# Patient Record
Sex: Male | Born: 1975
Health system: Southern US, Community
[De-identification: ages and names within clinical notes are randomized; demographics above are authoritative.]

## PROBLEM LIST (undated history)

## (undated) DIAGNOSIS — F17210 Nicotine dependence, cigarettes, uncomplicated: Secondary | ICD-10-CM

## (undated) DIAGNOSIS — E559 Vitamin D deficiency, unspecified: Secondary | ICD-10-CM

## (undated) DIAGNOSIS — E119 Type 2 diabetes mellitus without complications: Secondary | ICD-10-CM

## (undated) DIAGNOSIS — G4730 Sleep apnea, unspecified: Secondary | ICD-10-CM

## (undated) HISTORY — DX: Nicotine dependence, cigarettes, uncomplicated: F17.210

## (undated) HISTORY — DX: Vitamin D deficiency, unspecified: E55.9

## (undated) HISTORY — DX: Type 2 diabetes mellitus without complications: E11.9

## (undated) HISTORY — DX: Morbid (severe) obesity due to excess calories: E66.01

## (undated) HISTORY — PX: KNEE SURGERY: SHX244

## (undated) HISTORY — DX: Sleep apnea, unspecified: G47.30

---

## 1998-12-19 ENCOUNTER — Emergency Department (HOSPITAL_COMMUNITY): Admission: EM | Admit: 1998-12-19 | Discharge: 1998-12-19 | Payer: Self-pay | Admitting: Emergency Medicine

## 1998-12-19 ENCOUNTER — Encounter: Payer: Self-pay | Admitting: Emergency Medicine

## 2003-07-28 ENCOUNTER — Emergency Department (HOSPITAL_COMMUNITY): Admission: EM | Admit: 2003-07-28 | Discharge: 2003-07-28 | Payer: Self-pay | Admitting: Emergency Medicine

## 2013-10-02 ENCOUNTER — Ambulatory Visit: Payer: Self-pay | Admitting: Family Medicine

## 2013-10-02 VITALS — BP 128/70 | HR 97 | Temp 98.7°F | Resp 18 | Ht 73.0 in | Wt 346.0 lb

## 2013-10-02 DIAGNOSIS — G4733 Obstructive sleep apnea (adult) (pediatric): Secondary | ICD-10-CM

## 2013-10-02 DIAGNOSIS — Z0289 Encounter for other administrative examinations: Secondary | ICD-10-CM

## 2013-10-02 NOTE — Patient Instructions (Addendum)
Work hard on weight loss.  Advise stopping smoking  Return in one year or as needed  Advised to sometime come in for a general physical examination, not for the special DOT exams, but to do laboratory work and other baseline checking of him.

## 2013-10-02 NOTE — Progress Notes (Signed)
Examination:  History: 38 year old man who is here for a DOT physical exam. He is doing well with no major health issues except for a history of sleep apnea. He's been using CPAP for 4 years, uses it faithfully. His machine has diagnostic records on it, and he brought the machine in. This shows he is using his CPAP for an average of 7 hours a 5/90 days for a total of 876 hours in the last 90 days stretch. He says he has to use it all the time. Denies drowsiness problems when he is driving. No other major medical complaints  Past medical history: Operations: None Medical illnesses: None except for the sleep apnea  Regular medications: None Allergies: None   Family history: Father has diabetes. Otherwise no major familial diseases  Social history: Patient is divorced, has 3 children. He still has a relationship with his children. He has a significant other he lives with. He is driving takes him from [place] to [place]. He does smoke. He rarely drinks.  Review of systems:  Constitutional: Unremarkable HEENT: Unremarkable except for occasional bright sun-induced headache Respiratory: Unremarkable Heart pressure: Unremarkable Gastrointestinal: Unremarkable Genitourinary: Unremarkable Muscle skeletal: Unremarkable Dermatologic: Unremarkable Neurologic: Unremarkable Psychiatric: Unremarkable Endocrinologic: Unremarkable Hematologic: Unremarkable   Physical examination: Obese Afro-American male in no major acute distress. TMs are normal. Eyes PERRLA. Fundi benign. Throat was clear. Neck supple without nodes or thyromegaly. He has very large neck consistent with having sleep apnea. His chest is clear to auscultation. Heart regular without murmurs. Abdomen soft without mass or tenderness. Normal male external genitalia testes descended. No hernias. Spine normal. Rectal exam not done. Extremities unremarkable. Skin unremarkable. Spine unremarkable  Assessment: Truck drivers physical  examination Sleep apnea syndrome  Plan: Complete DOT card for one year Advised he must continue using the CPAP as he has been doing 2 strongly to lose weight and stop smoking.

## 2014-09-28 ENCOUNTER — Encounter: Payer: Self-pay | Admitting: Physician Assistant

## 2014-09-28 ENCOUNTER — Ambulatory Visit (INDEPENDENT_AMBULATORY_CARE_PROVIDER_SITE_OTHER): Payer: Self-pay | Admitting: Physician Assistant

## 2014-09-28 VITALS — BP 132/84 | HR 98 | Temp 98.5°F | Resp 18 | Ht 72.0 in | Wt 336.8 lb

## 2014-09-28 DIAGNOSIS — Z9989 Dependence on other enabling machines and devices: Secondary | ICD-10-CM

## 2014-09-28 DIAGNOSIS — Z6841 Body Mass Index (BMI) 40.0 and over, adult: Secondary | ICD-10-CM

## 2014-09-28 DIAGNOSIS — R739 Hyperglycemia, unspecified: Secondary | ICD-10-CM

## 2014-09-28 DIAGNOSIS — R81 Glycosuria: Secondary | ICD-10-CM

## 2014-09-28 DIAGNOSIS — G4733 Obstructive sleep apnea (adult) (pediatric): Secondary | ICD-10-CM

## 2014-09-28 DIAGNOSIS — Z024 Encounter for examination for driving license: Secondary | ICD-10-CM

## 2014-09-28 DIAGNOSIS — Z021 Encounter for pre-employment examination: Secondary | ICD-10-CM

## 2014-09-28 HISTORY — DX: Hyperglycemia, unspecified: R73.9

## 2014-09-28 HISTORY — DX: Body Mass Index (BMI) 40.0 and over, adult: Z684

## 2014-09-28 LAB — GLUCOSE, POCT (MANUAL RESULT ENTRY): POC Glucose: 242 mg/dl — AB (ref 70–99)

## 2014-09-28 NOTE — Progress Notes (Signed)
This patient presents for DOT examination for fitness for duty.  Last DOT certification was for 1 year, expiration date 10/03/2014.  Medical History: no  Any illness or injury in the last 5 years? no  Head/Brain Injuries, disorders or illnesses no  Seizures, epilepsy no  Eye disorders or impaired vision (except corrective lenses) no  Ear disorders, loss of hearing or balance no  Heart disease or heart attack; other cardiovascular condition no  Heart surgery (valve replacement/bypass, angioplasty, pacemaker) no  High blood pressure no  Muscular disease no  Shortness of breath no  Lung disease, emphysema, asthma, chronic bronchitis no  Kidney disease, dialysis no  Liver disease no  Digestive problems no  Diabetes or elevated blood sugar no  Nervious or psychiatric disorders, e.g., severe depression no  Loss of, or altered consciousness no  Fainting, dizziness yes  Sleep disorders, pauses in breathing while asleep, daytime sleepiness, loud snoring no  Stroke or paralysis no  Missing or impaired hand, arm, foot, leg, finger, toe no  Spinal injury or disease no  Chronic low back pain no  Regular, frequent alcohol use no  Narcotic or habit forming drug use  Current Medications: Prior to Admission medications   Not on File    Primary Care Provider: No primary care provider on file. Specialists: None  Medical Examiner's Comments on Health History:  OSA on CPAP. Compliance reviewed on the actual machine. In the past 30 days, used >4 hours 29 days and average use 8 hours/day.  TESTING:   Visual Acuity Screening   Right eye Left eye Both eyes  Without correction: 20/15 20/15 20/15   With correction:     Comments: Color: 6/6 HFOV: 85   Hearing Screening Comments: Whisper: 10 ft pass  Monocular Vision: No.  Hearing Aid used for test: No. Hearing Aid required to to meet standard: No.  BP 132/84 mmHg  Pulse 98  Temp(Src) 98.5 F (36.9 C) (Oral)  Resp 18  Ht 6' (1.829 m)   Wt 336 lb 12.8 oz (152.771 kg)  BMI 45.67 kg/m2  SpO2 96% Pulse rate is regular  Urine Specimen: Specific Gravity 1.030, Protein NEG, Blood NEG, Sugar 100  Other Testing: Finger Stick glucose, non-fasting 242 mg/dL  PHYSICAL EXAMINATION:  1. Yes.   General Appearance: Marked overweight, tremor, signs of alcoholism, problem drinking or drug abuse. 2. No. Eyes: pupillary equality, reaction to light, accommodation, ocular motility, ocular muscle imbalance, extra ocular movement, nystagmus, exopthalmos. Ask about retinopathy, cataracts, aphakia, glaucoma, macular degeneration and refer to a specialist if appropriate.  3. No. Ears: Scarring of tympanic membrane, occlusion of external canal, perforated eardrums.     4. No. Mouth and Throat: Irremedial deformities likely to interfere with breathing or swallowing.    5. No. Heart: Murmurs, extra sounds, enlarged heart, pacemaker, implantable defibrillator.     6. No. Lungs and Chest, not including breast examination: Abnormal Chest wall expansion, abnormal respiratory rate, abnormal breath sounds including wheezes or alveolar rates, impaired respiratory function, cyanosis. Abnormal findings on physical exam may require further testing such as pulmonary tests and/or x ray of chest.  7. No. Abdomen and Viscera: Enlarged liver, enlarged spleen, masses, bruits, hernia, significant abdominal wall muscle weakness.  8. No. Vascular System: Abnormal pulse and amplitude, carotid or arterial bruits, varicose veins.    9. No. Genitourinary System: Hernia  10. No. Extremities-Limb impaired: Loss or impairment of leg, foot, toe, arm, hand, finger. Perceptible limp, deformities, atrophy, weakness, paralysis, clubbing, edema, hypotonia. Insufficient grasp  and prehension to maintain steering wheel grip. Insufficient mobility and strength in lower limb to operate pedals properly. 11. No. Spine, other musculoskeletal: Previous surgery, deformities, limitation of  motion, tenderness.  12. No. Neurological: Impaired equilibrium, coordination or speech pattern; paresthesia, asymmetric deep tendon reflexes, sensory or positional abnormalities, abnormal patellar and Babinski's reflexes, ataxia.     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 Needs to establish with PCP for additional evaluation of hyperglycemia (suspect new diagnosis of Diabetes Mellitus type 2).  Meets standards, but periodic monitoring required due to: OSA, hyperglycemia/diabetes  Driver qualified only for: 3 months  Wearing corrective lenses: no Wearing hearing aid: no Accompanied by a N/A waiver/exemption Skill performance Evaluation (SPE) Certificate: no Driving within an exempt intracity zone: no Qualified by operation of 49 CFR 321.22: no  Certification expires 12/28/2014

## 2015-01-18 ENCOUNTER — Ambulatory Visit: Payer: Self-pay | Admitting: Physician Assistant

## 2016-08-27 ENCOUNTER — Telehealth: Payer: Self-pay | Admitting: Internal Medicine

## 2016-08-27 ENCOUNTER — Ambulatory Visit (INDEPENDENT_AMBULATORY_CARE_PROVIDER_SITE_OTHER): Payer: Managed Care, Other (non HMO) | Admitting: Family Medicine

## 2016-08-27 ENCOUNTER — Encounter: Payer: Self-pay | Admitting: Family Medicine

## 2016-08-27 VITALS — BP 140/82 | HR 90 | Ht 72.5 in | Wt 315.0 lb

## 2016-08-27 DIAGNOSIS — F17200 Nicotine dependence, unspecified, uncomplicated: Secondary | ICD-10-CM | POA: Diagnosis not present

## 2016-08-27 DIAGNOSIS — Z0000 Encounter for general adult medical examination without abnormal findings: Secondary | ICD-10-CM

## 2016-08-27 DIAGNOSIS — Z202 Contact with and (suspected) exposure to infections with a predominantly sexual mode of transmission: Secondary | ICD-10-CM

## 2016-08-27 DIAGNOSIS — Z9989 Dependence on other enabling machines and devices: Secondary | ICD-10-CM

## 2016-08-27 DIAGNOSIS — R739 Hyperglycemia, unspecified: Secondary | ICD-10-CM

## 2016-08-27 DIAGNOSIS — E1142 Type 2 diabetes mellitus with diabetic polyneuropathy: Secondary | ICD-10-CM | POA: Insufficient documentation

## 2016-08-27 DIAGNOSIS — G4733 Obstructive sleep apnea (adult) (pediatric): Secondary | ICD-10-CM | POA: Diagnosis not present

## 2016-08-27 DIAGNOSIS — E119 Type 2 diabetes mellitus without complications: Secondary | ICD-10-CM | POA: Diagnosis not present

## 2016-08-27 DIAGNOSIS — E661 Drug-induced obesity: Secondary | ICD-10-CM | POA: Insufficient documentation

## 2016-08-27 DIAGNOSIS — Z23 Encounter for immunization: Secondary | ICD-10-CM

## 2016-08-27 LAB — CBC WITH DIFFERENTIAL/PLATELET
Basophils Absolute: 86 cells/uL (ref 0–200)
Basophils Relative: 1 %
Eosinophils Absolute: 86 cells/uL (ref 15–500)
Eosinophils Relative: 1 %
HCT: 46.8 % (ref 38.5–50.0)
Hemoglobin: 15.6 g/dL (ref 13.2–17.1)
Lymphocytes Relative: 43 %
Lymphs Abs: 3698 cells/uL (ref 850–3900)
MCH: 29.4 pg (ref 27.0–33.0)
MCHC: 33.3 g/dL (ref 32.0–36.0)
MCV: 88.3 fL (ref 80.0–100.0)
MPV: 10 fL (ref 7.5–12.5)
Monocytes Absolute: 688 cells/uL (ref 200–950)
Monocytes Relative: 8 %
Neutro Abs: 4042 cells/uL (ref 1500–7800)
Neutrophils Relative %: 47 %
Platelets: 241 10*3/uL (ref 140–400)
RBC: 5.3 MIL/uL (ref 4.20–5.80)
RDW: 13.8 % (ref 11.0–15.0)
WBC: 8.6 10*3/uL (ref 4.0–10.5)

## 2016-08-27 LAB — POCT URINALYSIS DIPSTICK
Bilirubin, UA: NEGATIVE
Blood, UA: NEGATIVE
Ketones, UA: NEGATIVE
Leukocytes, UA: NEGATIVE
Nitrite, UA: NEGATIVE
Protein, UA: NEGATIVE
Spec Grav, UA: >=1.03
Urobilinogen, UA: NEGATIVE
pH, UA: 6

## 2016-08-27 LAB — POCT CBG (FASTING - GLUCOSE)-MANUAL ENTRY: Glucose Fasting, POC: 206 mg/dL — AB (ref 70–99)

## 2016-08-27 LAB — LIPID PANEL
CHOL/HDL RATIO: 5.7 ratio — AB (ref <5.0)
CHOLESTEROL: 159 mg/dL (ref <200)
HDL: 28 mg/dL — ABNORMAL LOW (ref >40)
LDL CALC: 92 mg/dL (ref <100)
Triglycerides: 194 mg/dL — ABNORMAL HIGH (ref <150)
VLDL: 39 mg/dL — AB (ref <30)

## 2016-08-27 LAB — COMPREHENSIVE METABOLIC PANEL
ALT: 38 U/L (ref 9–46)
AST: 29 U/L (ref 10–40)
Albumin: 4.3 g/dL (ref 3.6–5.1)
Alkaline Phosphatase: 67 U/L (ref 40–115)
BUN: 8 mg/dL (ref 7–25)
CO2: 25 mmol/L (ref 20–31)
Calcium: 9.6 mg/dL (ref 8.6–10.3)
Chloride: 102 mmol/L (ref 98–110)
Creat: 0.78 mg/dL (ref 0.60–1.35)
Glucose, Bld: 257 mg/dL — ABNORMAL HIGH (ref 65–99)
Potassium: 4.4 mmol/L (ref 3.5–5.3)
Sodium: 137 mmol/L (ref 135–146)
Total Bilirubin: 0.4 mg/dL (ref 0.2–1.2)
Total Protein: 7 g/dL (ref 6.1–8.1)

## 2016-08-27 LAB — TSH: TSH: 1.29 mIU/L (ref 0.40–4.50)

## 2016-08-27 LAB — POCT GLYCOSYLATED HEMOGLOBIN (HGB A1C): Hemoglobin A1C: 13.9

## 2016-08-27 MED ORDER — INSULIN GLARGINE 300 UNIT/ML ~~LOC~~ SOPN: 10.0000 [IU] | PEN_INJECTOR | Freq: Every evening | SUBCUTANEOUS | 2 refills | Status: DC

## 2016-08-27 MED ORDER — BLOOD GLUCOSE TEST VI STRP: ORAL_STRIP | 4 refills | Status: DC

## 2016-08-27 MED ORDER — METRONIDAZOLE 500 MG PO TABS: 2000.0000 mg | ORAL_TABLET | Freq: Once | ORAL | 0 refills | Status: AC

## 2016-08-27 MED ORDER — METFORMIN HCL 500 MG PO TABS: 500.0000 mg | ORAL_TABLET | Freq: Two times a day (BID) | ORAL | 1 refills | Status: DC

## 2016-08-27 MED ORDER — ONETOUCH DELICA LANCETS FINE MISC: 5 refills | Status: DC

## 2016-08-27 NOTE — Patient Instructions (Addendum)
Start Metformin twice daily with meals. Start Toujeo 10 units every evening.  Check your blood sugars twice daily. Once fasting in the morning and 2 hours after a meal.  Keep a record and bring this in to your appointment in 2 weeks.  Goal blood sugar range for fasting is 90-130. Goal blood sugar range for 2 hours after a meal is 130-180.   Call and schedule a diabetic eye exam.  The nutritionist will call you to schedule an appointment.   Do not drink alcohol with the antibiotic.   Cut back on smoking.   Preventative Care for Adults, Male       REGULAR HEALTH EXAMS:  A routine yearly physical is a good way to check in with your primary care provider about your health and preventive screening. It is also an opportunity to share updates about your health and any concerns you have, and receive a thorough all-over exam.   Most health insurance companies pay for at least some preventative services.  Check with your health plan for specific coverages.  WHAT PREVENTATIVE SERVICES DO MEN NEED?  Adult men should have their weight and blood pressure checked regularly.   Men age 74 and older should have their cholesterol levels checked regularly.  Beginning at age 54 and continuing to age 70, men should be screened for colorectal cancer.  Certain people should may need continued testing until age 75.  Other cancer screening may include exams for testicular and prostate cancer.  Updating vaccinations is part of preventative care.  Vaccinations help protect against diseases such as the flu.  Lab tests are generally done as part of preventative care to screen for anemia and blood disorders, to screen for problems with the kidneys and liver, to screen for bladder problems, to check blood sugar, and to check your cholesterol level.  Preventative services generally include counseling about diet, exercise, avoiding tobacco, drugs, excessive alcohol consumption, and sexually transmitted infections.     GENERAL RECOMMENDATIONS FOR GOOD HEALTH:  Healthy diet:  Eat a variety of foods, including fruit, vegetables, animal or vegetable protein, such as meat, fish, chicken, and eggs, or beans, lentils, tofu, and grains, such as rice.  Drink plenty of water daily.  Decrease saturated fat in the diet, avoid lots of red meat, processed foods, sweets, fast foods, and fried foods.  Exercise:  Aerobic exercise helps maintain good heart health. At least 30-40 minutes of moderate-intensity exercise is recommended. For example, a brisk walk that increases your heart rate and breathing. This should be done on most days of the week.   Find a type of exercise or a variety of exercises that you enjoy so that it becomes a part of your daily life.  Examples are running, walking, swimming, water aerobics, and biking.  For motivation and support, explore group exercise such as aerobic class, spin class, Zumba, Yoga,or  martial arts, etc.    Set exercise goals for yourself, such as a certain weight goal, walk or run in a race such as a 5k walk/run.  Speak to your primary care provider about exercise goals.  Disease prevention:  If you smoke or chew tobacco, find out from your caregiver how to quit. It can literally save your life, no matter how long you have been a tobacco user. If you do not use tobacco, never begin.   Maintain a healthy diet and normal weight. Increased weight leads to problems with blood pressure and diabetes.   The Body Mass Index or  BMI is a way of measuring how much of your body is fat. Having a BMI above 27 increases the risk of heart disease, diabetes, hypertension, stroke and other problems related to obesity. Your caregiver can help determine your BMI and based on it develop an exercise and dietary program to help you achieve or maintain this important measurement at a healthful level.  High blood pressure causes heart and blood vessel problems.  Persistent high blood pressure  should be treated with medicine if weight loss and exercise do not work.   Fat and cholesterol leaves deposits in your arteries that can block them. This causes heart disease and vessel disease elsewhere in your body.  If your cholesterol is found to be high, or if you have heart disease or certain other medical conditions, then you may need to have your cholesterol monitored frequently and be treated with medication.   Ask if you should have a stress test if your history suggests this. A stress test is a test done on a treadmill that looks for heart disease. This test can find disease prior to there being a problem.  Avoid drinking alcohol in excess (more than two drinks per day).  Avoid use of street drugs. Do not share needles with anyone. Ask for professional help if you need assistance or instructions on stopping the use of alcohol, cigarettes, and/or drugs.  Brush your teeth twice a day with fluoride toothpaste, and floss once a day. Good oral hygiene prevents tooth decay and gum disease. The problems can be painful, unattractive, and can cause other health problems. Visit your dentist for a routine oral and dental check up and preventive care every 6-12 months.   Look at your skin regularly.  Use a mirror to look at your back. Notify your caregivers of changes in moles, especially if there are changes in shapes, colors, a size larger than a pencil eraser, an irregular border, or development of new moles.  Safety:  Use seatbelts 100% of the time, whether driving or as a passenger.  Use safety devices such as hearing protection if you work in environments with loud noise or significant background noise.  Use safety glasses when doing any work that could send debris in to the eyes.  Use a helmet if you ride a bike or motorcycle.  Use appropriate safety gear for contact sports.  Talk to your caregiver about gun safety.  Use sunscreen with a SPF (or skin protection factor) of 15 or greater.   Lighter skinned people are at a greater risk of skin cancer. Don't forget to also wear sunglasses in order to protect your eyes from too much damaging sunlight. Damaging sunlight can accelerate cataract formation.   Practice safe sex. Use condoms. Condoms are used for birth control and to help reduce the spread of sexually transmitted infections (or STIs).  Some of the STIs are gonorrhea (the clap), chlamydia, syphilis, trichomonas, herpes, HPV (human papilloma virus) and HIV (human immunodeficiency virus) which causes AIDS. The herpes, HIV and HPV are viral illnesses that have no cure. These can result in disability, cancer and death.   Keep carbon monoxide and smoke detectors in your home functioning at all times. Change the batteries every 6 months or use a model that plugs into the wall.   Vaccinations:  Stay up to date with your tetanus shots and other required immunizations. You should have a booster for tetanus every 10 years. Be sure to get your flu shot every year, since 5%-20%  of the [place] population comes down with the flu. The flu vaccine changes each year, so being vaccinated once is not enough. Get your shot in the fall, before the flu season peaks.   Other vaccines to consider:  Pneumococcal vaccine to protect against certain types of pneumonia.  This is normally recommended for adults age 17 or older.  However, adults younger than 41 years old with certain underlying conditions such as diabetes, heart or lung disease should also receive the vaccine.  Shingles vaccine to protect against Varicella Zoster if you are older than age 49, or younger than 41 years old with certain underlying illness.  Hepatitis A vaccine to protect against a form of infection of the liver by a virus acquired from food.  Hepatitis B vaccine to protect against a form of infection of the liver by a virus acquired from blood or body fluids, particularly if you work in health care.  If you plan to travel  internationally, check with your local health department for specific vaccination recommendations.  Cancer Screening:  Most routine colon cancer screening begins at the age of 71. On a yearly basis, doctors may provide special easy to use take-home tests to check for hidden blood in the stool. Sigmoidoscopy or colonoscopy can detect the earliest forms of colon cancer and is life saving. These tests use a Reuter camera at the end of a tube to directly examine the colon. Speak to your caregiver about this at age 69, when routine screening begins (and is repeated every 5 years unless early forms of pre-cancerous polyps or Kostka growths are found).   At the age of 80 men usually start screening for prostate cancer every year. Screening may begin at a younger age for those with higher risk. Those at higher risk include African-Americans or having a family history of prostate cancer. There are two types of tests for prostate cancer:   Prostate-specific antigen (PSA) testing. Recent studies raise questions about prostate cancer using PSA and you should discuss this with your caregiver.   Digital rectal exam (in which your doctor's lubricated and gloved finger feels for enlargement of the prostate through the anus).   Screening for testicular cancer.  Do a monthly exam of your testicles. Gently roll each testicle between your thumb and fingers, feeling for any abnormal lumps. The best time to do this is after a hot shower or bath when the tissues are looser. Notify your caregivers of any lumps, tenderness or changes in size or shape immediately.

## 2016-08-27 NOTE — Telephone Encounter (Signed)
Spoke to patient about his cpap. He has been getting his equipment from [employer] out of [place]. He has not had a new machine or sleep study since 2010. [facility] does not have sleep study on file. Pt will try to get sleep study for me and if not we will have to send for new sleep study. Pt would like to go someplace in [place] to get supplies.

## 2016-08-27 NOTE — Progress Notes (Signed)
Subjective:    Patient ID: Tim Allen, male    DOB: 1976-03-13, 41 y.o.   MRN: [id number]  HPI Chief Complaint  Patient presents with  . new pt    new pt cpe, wanting to quit smoking   He is new to the practice and here for a complete physical exam. States he was diagnosed with sleep apnea at [facility] and is using a CPAP. Has been using it nightly for 5 years. States he cannot sleep without it.  States it is time for new equipment. Would like order for this.   Dr. Benjamine Mola was his last PCP at [facility].   States he is smoking 2 ppd for the past 10 years and smoking total for 20 years. Has tried nicotine patches.  Wife positive for trich. Denies having penile discharge.   Previous medical care: nothing in years.  Last CPE: years   Other providers: none  States he has had elevated blood sugars in the past. States he has been borderline diabetic. Does not eat healthy or exercise. 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  He is symptomatic with urinary frequency, increased thirst and hunger. Denies blurred or double vision.   Social history: Lives with spouse , works as Administrator  Denies alcohol or drug use.   Immunizations: Tdap unknown.   Health maintenance:  Colonoscopy: N/A Last PSA: N/A Last Dental Exam:  Once annually  Last Eye Exam: September 2017, this was not a dilated exam.   Wears seatbelt always, smoke detectors in home and functioning, does not text while driving, feels safe in home environment. Guns in home and locked up.   Reviewed allergies, medications, past medical, surgical, family, and social history.   Review of Systems ROS  Review of Systems Constitutional: -fever, -chills, -sweats, -unexpected weight change,-fatigue ENT: -runny nose, -ear pain, -sore throat Cardiology:  -chest pain, -palpitations, -edema Respiratory: -cough, -shortness of breath, -wheezing Gastroenterology: -abdominal pain, -nausea, -vomiting, -diarrhea, -constipation  Hematology:  -bleeding or bruising problems Musculoskeletal: -arthralgias, -myalgias, -joint swelling, -back pain Ophthalmology: -vision changes Urology: -dysuria, -difficulty urinating, -hematuria, +urinary frequency, -urgency Neurology: -headache, -weakness, -tingling, -numbness       Objective:   Physical Exam BP 140/82   Pulse 90   Ht 6' 0.5" (1.842 m)   Wt (!) 315 lb (142.9 kg)   BMI 42.13 kg/m   General Appearance:    Alert, cooperative, no distress, appears stated age  Head:    Normocephalic, without obvious abnormality, atraumatic  Eyes:    PERRL, conjunctiva/corneas clear, EOM's intact, fundi    benign  Ears:    Normal TM's and external ear canals  Nose:   Nares normal, mucosa normal, no drainage or sinus   tenderness  Throat:   Lips, mucosa, and tongue normal; teeth and gums normal  Neck:   Supple, no lymphadenopathy;  thyroid:  no   enlargement/tenderness/nodules; no carotid   bruit or JVD  Back:    Spine nontender, no curvature, ROM normal, no CVA     tenderness  Lungs:     Clear to auscultation bilaterally without wheezes, rales or     ronchi; respirations unlabored  Chest Wall:    No tenderness or deformity   Heart:    Regular rate and rhythm, S1 and S2 normal, no murmur, rub   or gallop  Breast Exam:    No chest wall tenderness, masses or gynecomastia  Abdomen:     Soft, non-tender, nondistended, normoactive bowel sounds,  no masses, no hepatosplenomegaly  Genitalia:    Refused.   Rectal:    Refused. .  Extremities:   No clubbing, cyanosis or edema  Pulses:   2+ and symmetric all extremities  Skin:   Skin color, texture, turgor normal, no rashes or lesions. Multiple tattoos.   Lymph nodes:   Cervical, supraclavicular, and axillary nodes normal  Neurologic:   CNII-XII intact, normal strength, sensation and gait; reflexes 2+ and symmetric throughout          Psych:   Normal mood, affect, hygiene and grooming.    Urinalysis dipstick: 2+glucose, no ketones.        Assessment & Plan:  Routine general medical examination at a health care facility - Plan: Urinalysis Dipstick, CBC with Differential/Platelet, Comprehensive metabolic panel, TSH, Lipid panel  Elevated blood sugar - Plan: HgB A1c, Glucose (CBG), Fasting, Microalbumin / creatinine urine ratio  New onset type 2 diabetes mellitus ([place]) - Plan: TSH, Amb ref to Medical Nutrition Therapy-MNT  Morbid obesity ([place]) - Plan: TSH, Lipid panel  OSA on CPAP  STD exposure - Plan: RPR, GC/Chlamydia Probe Amp, HIV antibody  Smoker  Need for Tdap vaccination - Plan: Tdap vaccine greater than or equal to 7yo IM  Hemoglobin A1c 13.9%.  POCT fasting glucose 206.  Start metformin twice daily and Toujeo 10 units every evening.  1 sample Toujeo pen given to patient.  He was educated on checking blood sugars and will check BS twice daily, fasting and 2 hours after a meal. He was advised to bring in readings in 2 weeks.  Nutrition referral made.  Needs to schedule diabetic eye exam. He is smoking 2 ppd and advised him to cut back. He is not ready to quit.  Counseled him on risk factors for heart disease including obesity, diabetes, smoking.  Will treat him for Trich since his wife tested positive. He has multiple sexual partners and advised him to have all partners treated. Advised him to avoid alcohol with antibiotic.  Tdap given.  Would like to have CPAP checked and updated.   Follow up 2 weeks and need to repeat urinalysis. He will need an ace inhibitor for renal protection and statin.  Did not want to overwhelm him today, he is having to take his father off of life support later today.

## 2016-08-28 ENCOUNTER — Other Ambulatory Visit: Payer: Self-pay | Admitting: Family Medicine

## 2016-08-28 DIAGNOSIS — I2510 Atherosclerotic heart disease of native coronary artery without angina pectoris: Secondary | ICD-10-CM

## 2016-08-28 LAB — RPR

## 2016-08-28 LAB — MICROALBUMIN / CREATININE URINE RATIO
Creatinine, Urine: 182 mg/dL (ref 20–370)
Microalb Creat Ratio: 3 mcg/mg creat (ref <30)
Microalb, Ur: 0.6 mg/dL

## 2016-08-28 LAB — HIV ANTIBODY (ROUTINE TESTING W REFLEX): HIV 1&2 Ab, 4th Generation: NONREACTIVE

## 2016-08-28 LAB — GC/CHLAMYDIA PROBE AMP
CT Probe RNA: NOT DETECTED
GC PROBE AMP APTIMA: NOT DETECTED

## 2016-08-28 MED ORDER — ATORVASTATIN CALCIUM 20 MG PO TABS: 20.0000 mg | ORAL_TABLET | Freq: Every day | ORAL | 1 refills | Status: DC

## 2016-08-29 ENCOUNTER — Other Ambulatory Visit: Payer: Self-pay | Admitting: Family Medicine

## 2016-08-29 DIAGNOSIS — E7889 Other lipoprotein metabolism disorders: Secondary | ICD-10-CM

## 2016-08-30 ENCOUNTER — Telehealth: Payer: Self-pay | Admitting: Family Medicine

## 2016-08-30 NOTE — Telephone Encounter (Signed)
Rcvd office notes from Dr Morene Crocker at [facility]

## 2016-09-10 ENCOUNTER — Encounter: Payer: Self-pay | Admitting: Family Medicine

## 2016-09-10 ENCOUNTER — Ambulatory Visit (INDEPENDENT_AMBULATORY_CARE_PROVIDER_SITE_OTHER): Payer: Managed Care, Other (non HMO) | Admitting: Family Medicine

## 2016-09-10 VITALS — BP 130/80 | HR 86 | Wt 318.4 lb

## 2016-09-10 DIAGNOSIS — IMO0001 Reserved for inherently not codable concepts without codable children: Secondary | ICD-10-CM

## 2016-09-10 DIAGNOSIS — E1165 Type 2 diabetes mellitus with hyperglycemia: Secondary | ICD-10-CM

## 2016-09-10 MED ORDER — DULAGLUTIDE 0.75 MG/0.5ML ~~LOC~~ SOAJ: 1.0000 "application " | SUBCUTANEOUS | 1 refills | Status: DC

## 2016-09-10 MED ORDER — DAPAGLIFLOZIN PROPANEDIOL 5 MG PO TABS: 5.0000 mg | ORAL_TABLET | Freq: Every day | ORAL | 1 refills | Status: DC

## 2016-09-10 MED ORDER — METFORMIN HCL 1000 MG PO TABS: 1000.0000 mg | ORAL_TABLET | Freq: Two times a day (BID) | ORAL | 1 refills | Status: DC

## 2016-09-10 NOTE — Progress Notes (Signed)
   Subjective:    Patient ID: Tim Allen, male    DOB: 1975-12-14, 41 y.o.   MRN: [id number]  HPI Chief Complaint  Patient presents with  . 2 week follow-up    2 week follow-up. today was fasting at 197. staying in the 200's    He is here to follow up on diabetes. A1c 13.9% 08/27/2016.  He arrived early for his appointment and verbalized that he was upset about waiting to be seen. States he "is trying to be patient but has things to do".   States his fasting BS range has been 200 - 250.  He has been taking Toujeo and metformin without difficulty.  States he is no longer having urinary frequency but he continues to have increased thirst.   States he has a nutritionist appointment April 9th, he and his wife both.   Denies any new complaints or concerns today.   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   Reviewed allergies, medications, past medical,  and social history.   Review of Systems Pertinent positives and negatives in the history of present illness.     Objective:   Physical Exam BP 130/80   Pulse 86   Wt (!) 318 lb 6.4 oz (144.4 kg)   BMI 42.59 kg/m   Alert and oriented and in no acute distress. Not otherwise examined.       Assessment & Plan:  Uncontrolled type 2 diabetes mellitus without complication, without long-term current use of insulin ([place]) - Plan: metFORMIN (GLUCOPHAGE) 1000 MG tablet, dapagliflozin propanediol (FARXIGA) 5 MG TABS tablet, Dulaglutide (TRULICITY) 9.79 YI/0.1KP SOPN  Patient verbalized that he did not have time to spend talking about his diabetes today. He was visibly and verbally upset about having to wait to be seen.  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 Discussed multiple options for getting diabetes under better control including taking  the next 1-2 months off of work to take insulin or STOP insulin and try other medications. He states he cannot take time off from work. We will stop Toujeo and increase his Metformin and add a SGLT2 and GLP1.  Stressed the importance of checking blood sugars twice daily and being aware of any low blood sugars.  Will have him see the MNT as scheduled in April. He states he does not have time to exercise. Advised him that medication can only do so much and that healthy diet and being physically active is the foundation for controlling blood sugars and his illness.  Requested that he call me in 2 weeks with BS readings and return in 1 month. He started taking a statin 2 weeks ago and will need to be put on an ace inhibitor for renal protection at his next visit.  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

## 2016-09-10 NOTE — Patient Instructions (Addendum)
STOP taking insulin TOUJEO.   Check with your pharmacy later and I will send in new prescriptions.  Call me in 2 weeks with your blood sugar readings.  Follow up in 1 month.

## 2016-09-11 ENCOUNTER — Encounter: Payer: Self-pay | Admitting: Family Medicine

## 2016-09-11 ENCOUNTER — Telehealth: Payer: Self-pay | Admitting: Family Medicine

## 2016-09-11 NOTE — Telephone Encounter (Signed)
Wife states Trulicity is not covered at all & cost is $700 a month, Bydureon will be $300 a month with discount card, asked that you please switch him

## 2016-09-11 NOTE — Telephone Encounter (Signed)
Please take care of this, switching him to Bydureon instead of Trulicity. I messaged his wife and told her to check and see if we have a sample of bydureon and to come by for a coupon for [place] if she does not have one.

## 2016-09-12 ENCOUNTER — Encounter: Payer: Self-pay | Admitting: Internal Medicine

## 2016-09-12 ENCOUNTER — Other Ambulatory Visit: Payer: Self-pay | Admitting: Family Medicine

## 2016-09-12 MED ORDER — EXENATIDE ER 2 MG/0.85ML ~~LOC~~ AUIJ: 1.0000 "pen " | AUTO-INJECTOR | SUBCUTANEOUS | 0 refills | Status: DC

## 2016-09-12 MED ORDER — EXENATIDE ER 2 MG/0.85ML ~~LOC~~ AUIJ: 1.0000 "pen " | AUTO-INJECTOR | SUBCUTANEOUS | 2 refills | Status: DC

## 2016-09-12 NOTE — Telephone Encounter (Signed)
Sent to pharmacy 

## 2016-10-01 ENCOUNTER — Encounter: Payer: Managed Care, Other (non HMO) | Attending: Family Medicine | Admitting: Dietician

## 2016-10-01 ENCOUNTER — Encounter: Payer: Self-pay | Admitting: Dietician

## 2016-10-01 DIAGNOSIS — F17200 Nicotine dependence, unspecified, uncomplicated: Secondary | ICD-10-CM | POA: Insufficient documentation

## 2016-10-01 DIAGNOSIS — Z713 Dietary counseling and surveillance: Secondary | ICD-10-CM | POA: Diagnosis present

## 2016-10-01 DIAGNOSIS — E119 Type 2 diabetes mellitus without complications: Secondary | ICD-10-CM | POA: Diagnosis present

## 2016-10-01 DIAGNOSIS — G4733 Obstructive sleep apnea (adult) (pediatric): Secondary | ICD-10-CM | POA: Insufficient documentation

## 2016-10-01 DIAGNOSIS — C50929 Malignant neoplasm of unspecified site of unspecified male breast: Secondary | ICD-10-CM | POA: Insufficient documentation

## 2016-10-01 NOTE — Patient Instructions (Addendum)
Rethink what you drink. Begin looking at your portion sizes.  Consider decreasing them. When eating out, avoid super sizing. When eating carbohydrates, choose a [place] amount of protein. Bake, broil or grill rather than fry.  Aim for 5 Carb Choices per meal (75 grams) +/- 1 either way  Aim for 0-2 Carbs per snack if hungry  Include protein in moderation with your meals and snacks Consider reading food labels for Total Carbohydrate and Fat Grams of foods Consider  increasing your activity level by walking or other for 30 minutes daily as tolerated Consider checking BG at alternate times per day as directed by MD  Consider taking medication as directed by MD

## 2016-10-02 NOTE — Progress Notes (Signed)
Diabetes Self-Management Education  Visit Type: First/Initial  Appt. Start Time: 1410 Appt. End Time: 0938  10/02/2016  Mr. Tim Allen, identified by name and date of birth, is a 41 y.o. male with a diagnosis of Diabetes: Type 2.  This is a new diagnosis.  Other hx includes OSA and uses C-pap.  He smokes.  He reports a fear of needles and it took him a while to begin taking his injectable medication.  Medications include:  Farxiga, Bydureon, Metformin.  He complains of diarrhea with the Metformin.  He has not started the Lipitor yet because he does not want to.  Patient lives with his wife.  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  He does load and unload his truck.  He cooks when he is home and he and his wife share shopping.  They also have 54 and 57 yo children.  His wife was also just diagnosed with type 2 diabetes and breast cancer.  He used to drink a 12 pack of regular soda per day and has decreased this to 2 per day.  Further change is overwhelming him and is resistant to making further changes to his diet or exercise.  His wife is trying to encourage him to make little changes.  He was on his cell phone much of the visit.  ASSESSMENT  Height 6' 1"  (1.854 m), weight (!) 317 lb (143.8 kg). Body mass index is 41.82 kg/m.  He has weighed >300 lbs for 6 years.      Diabetes Self-Management Education - 10/01/16 1428      Visit Information   Visit Type First/Initial     Initial Visit   Diabetes Type Type 2   Are you currently following a meal plan? No   Are you taking your medications as prescribed? Yes   Date Diagnosed 08/2016     Health Coping   How would you rate your overall health? Fair     Psychosocial Assessment   Patient Belief/Attitude about Diabetes Other (comment)  not any of these   Self-care barriers Other (comment)  truck driver ([place])   Self-management support [place] office;Family   Other persons present  Patient;Spouse/SO   Patient Concerns Nutrition/Meal planning;Glycemic Control;Weight Control   Special Needs None   Preferred Learning Style No preference indicated   Learning Readiness Ready   How often do you need to have someone help you when you read instructions, pamphlets, or other written materials from your doctor or pharmacy? 1 - Never   What is the last grade level you completed in school? 12th grade     Pre-Education Assessment   Patient understands the diabetes disease and treatment process. Needs Instruction   Patient understands incorporating nutritional management into lifestyle. Needs Instruction   Patient undertands incorporating physical activity into lifestyle. Needs Instruction   Patient understands using medications safely. Needs Instruction   Patient understands monitoring blood glucose, interpreting and using results Needs Instruction   Patient understands prevention, detection, and treatment of acute complications. Needs Instruction   Patient understands prevention, detection, and treatment of chronic complications. Needs Instruction   Patient understands how to develop strategies to address psychosocial issues. Needs Instruction   Patient understands how to develop strategies to promote health/change behavior. Needs Instruction     Complications   Last HgB A1C per patient/outside source 13.9 %  08/27/2016   How often do you check your blood sugar? 1-2 times/day  Fasting Blood glucose range (mg/dL) 130-179   Number of hypoglycemic episodes per month 0   Number of hyperglycemic episodes per week 7   Have you had a dilated eye exam in the past 12 months? No   Have you had a dental exam in the past 12 months? Yes   Are you checking your feet? Yes   How many days per week are you checking your feet? 7     Dietary Intake   Breakfast 2 Sausage biscuits and whole milk OR cereal and whole milk   Snack (morning) none   Lunch skips   Snack (afternoon) peanuts, raisins,  fruit (quit [employer])   Dinner [employer] or truck stop   Snack (evening) salad, cookies   Beverage(s) regular soda (2 cans per day decreased from 8 cans per day since diagnosis), water, sweet tea, vitamin water     Exercise   Exercise Type Moderate (swimming / aerobic walking)   How many days per week to you exercise? 3   How many minutes per day do you exercise? 60   Total minutes per week of exercise 180     Patient Education   Previous Diabetes Education No   Disease state  Definition of diabetes, type 1 and 2, and the diagnosis of diabetes   Nutrition management  Role of diet in the treatment of diabetes and the relationship between the three main macronutrients and blood glucose level;Food label reading, portion sizes and measuring food.;Meal options for control of blood glucose level and chronic complications.;Information on hints to eating out and maintain blood glucose control.   Physical activity and exercise  Role of exercise on diabetes management, blood pressure control and cardiac health.;Helped patient identify appropriate exercises in relation to his/her diabetes, diabetes complications and other health issue.   Monitoring Purpose and frequency of SMBG.;Identified appropriate SMBG and/or A1C goals.;Daily foot exams;Yearly dilated eye exam   Acute complications Taught treatment of hypoglycemia - the 15 rule.;Discussed and identified patients' treatment of hyperglycemia.   Chronic complications Relationship between chronic complications and blood glucose control;Assessed and discussed foot care and prevention of foot problems;Dental care;Retinopathy and reason for yearly dilated eye exams   Psychosocial adjustment Worked with patient to identify barriers to care and solutions;Role of stress on diabetes;Travel strategies;Identified and addressed patients feelings and concerns about diabetes;Brainstormed with patient on coping mechanisms for social situations, getting support from  significant others, dealing with feelings about diabetes   Personal strategies to promote health Review risk of smoking and offered smoking cessation;Lifestyle issues that need to be addressed for better diabetes care     Individualized Goals (developed by patient)   Nutrition General guidelines for healthy choices and portions discussed   Physical Activity Exercise 3-5 times per week;30 minutes per day   Medications take my medication as prescribed   Monitoring  test my blood glucose as discussed   Problem Solving portion sizes, meal timing   Reducing Risk stop smoking;do foot checks daily;Other (comment)  reduce sugary drinks   Health Coping discuss diabetes with (comment)  MD/RD/wife     Post-Education Assessment   Patient understands the diabetes disease and treatment process. Demonstrates understanding / competency   Patient understands incorporating nutritional management into lifestyle. Demonstrates understanding / competency   Patient undertands incorporating physical activity into lifestyle. Demonstrates understanding / competency   Patient understands using medications safely. Demonstrates understanding / competency   Patient understands monitoring blood glucose, interpreting and using results Demonstrates understanding / competency  Patient understands prevention, detection, and treatment of acute complications. Demonstrates understanding / competency   Patient understands prevention, detection, and treatment of chronic complications. Demonstrates understanding / competency   Patient understands how to develop strategies to address psychosocial issues. Demonstrates understanding / competency   Patient understands how to develop strategies to promote health/change behavior. Demonstrates understanding / competency     Outcomes   Expected Outcomes Demonstrated limited interest in learning.  Expect minimal changes   Future DMSE PRN   Program Status Completed       Individualized Plan for Diabetes Self-Management Training:   Learning Objective:  Patient will have a greater understanding of diabetes self-management. Patient education plan is to attend individual and/or group sessions per assessed needs and concerns.   Plan:   Patient Instructions  Rethink what you drink. Begin looking at your portion sizes.  Consider decreasing them. When eating out, avoid super sizing. When eating carbohydrates, choose a [place] amount of protein. Bake, broil or grill rather than fry.  Aim for 5 Carb Choices per meal (75 grams) +/- 1 either way  Aim for 0-2 Carbs per snack if hungry  Include protein in moderation with your meals and snacks Consider reading food labels for Total Carbohydrate and Fat Grams of foods Consider  increasing your activity level by walking or other for 30 minutes daily as tolerated Consider checking BG at alternate times per day as directed by MD  Consider taking medication as directed by MD        Expected Outcomes:  Demonstrated limited interest in learning.  Expect minimal changes  Education material provided: Living Well with Diabetes, Food label handouts, A1C conversion sheet, My Plate and Snack sheet  If problems or questions, patient to contact team via:  Phone and Email  Future DSME appointment: PRN

## 2016-10-08 ENCOUNTER — Encounter: Payer: Self-pay | Admitting: Family Medicine

## 2016-10-08 ENCOUNTER — Ambulatory Visit (INDEPENDENT_AMBULATORY_CARE_PROVIDER_SITE_OTHER): Payer: Managed Care, Other (non HMO) | Admitting: Family Medicine

## 2016-10-08 VITALS — BP 140/90 | HR 94 | Wt 321.4 lb

## 2016-10-08 DIAGNOSIS — Z9989 Dependence on other enabling machines and devices: Secondary | ICD-10-CM | POA: Diagnosis not present

## 2016-10-08 DIAGNOSIS — F17200 Nicotine dependence, unspecified, uncomplicated: Secondary | ICD-10-CM

## 2016-10-08 DIAGNOSIS — E119 Type 2 diabetes mellitus without complications: Secondary | ICD-10-CM | POA: Diagnosis not present

## 2016-10-08 DIAGNOSIS — G4700 Insomnia, unspecified: Secondary | ICD-10-CM | POA: Diagnosis not present

## 2016-10-08 DIAGNOSIS — G4733 Obstructive sleep apnea (adult) (pediatric): Secondary | ICD-10-CM | POA: Diagnosis not present

## 2016-10-08 MED ORDER — METFORMIN HCL ER 500 MG PO TB24: 1000.0000 mg | ORAL_TABLET | Freq: Every day | ORAL | 1 refills | Status: DC

## 2016-10-08 MED ORDER — LISINOPRIL 5 MG PO TABS: 5.0000 mg | ORAL_TABLET | Freq: Every day | ORAL | 1 refills | Status: DC

## 2016-10-08 NOTE — Patient Instructions (Signed)
Stop the metformin and start the extended release Glucophage 1,000 mg every morning and see how you do with this.   Start back on cholesterol medication, atorvastatin.   Try to be more active especially on your days off.

## 2016-10-08 NOTE — Progress Notes (Signed)
   Subjective:    Patient ID: Tim Allen, male    DOB: 1975/09/29, 41 y.o.   MRN: [id number]  HPI Chief Complaint  Patient presents with  . Diabetes    DM- sugars have been running all over the place, BS between 132-282.    He is here for a follow on diabetes and has other questions. Would like to know which medication is making him have more bowel movements.  His last hemoglobin was 13.9%  On March 5th. States he went to the nutritionist and has made healthy dietary changes but he admits that he often still eats sweets and pizza. Has cut back from 12 pack of soda per day to 2-3 per day.   States he is always tired and is not sleeping well. This is ongoing. 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  He has OSA and states his last sleep study was in [place] approximately 5 years ago. He is using his CPAP but states he would like a new sleep test and states he needs new equipment.   Blood sugars at home Fasting blood sugars range from 132-291  States he has not been taking his cholesterol medication.  He has not cut back on smoking and states he is smoking more.   Denies fever, chills, headache, dizziness, vision changes, polyuria, polydipsia. No chest pain, shortness of breath,  abdominal pain, N/V.   Reviewed allergies, medications, past medical,  and social history.    Review of Systems Pertinent positives and negatives in the history of present illness.     Objective:   Physical Exam BP 140/90   Pulse 94   Wt (!) 321 lb 6.4 oz (145.8 kg)   BMI 42.40 kg/m   Alert and oriented and in no acute distress. Not otherwise examined.       Assessment & Plan:  New onset type 2 diabetes mellitus ([place]) - Plan: metFORMIN (GLUCOPHAGE XR) 500 MG 24 hr tablet, POCT glycosylated hemoglobin (Hb A1C)  Morbid obesity ([facility])  OSA on CPAP - Plan: Home sleep test  Smoker  Insomnia, unspecified type - Plan: Home  sleep test  Discussed that he has made progress with his diet and blood sugars. His hemoglobin A1c is now out of the toxic range and down to 9.7%.  Plan to have him stop Metformin since this is most likely making him have more bowel movements and will switch him to Glucophage XR.  encouraged him to start the cholesterol medication.   Plan to start him on a low dose Ace inhibitor for renal protection.  He is requesting a sleep study. I will order this and he is aware that this may not be covered since his last sleep study was approximately 5 years ago. He may just need to have a visit for new equipment.  He is now smoking even more. Encourage him to start getting exercise and find something healthy to do instead of smoking.  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 He declines for now.  Will follow up in 1-2 months for DM or sooner if needed. He will need fasting lipids if he starts taking statin as recommended. Counseled on uncontrolled diabetes, HTN, hyperlipidemia and smoking and the health risks these present including kidney disease, heart disease, amputations, and even death.

## 2016-10-09 ENCOUNTER — Encounter: Payer: Self-pay | Admitting: Internal Medicine

## 2016-10-09 LAB — POCT GLYCOSYLATED HEMOGLOBIN (HGB A1C): Hemoglobin A1C: 9.7

## 2016-10-09 NOTE — Addendum Note (Signed)
Addended by: Minette Headland A on: 10/09/2016 09:01 AM   Modules accepted: Orders

## 2016-11-11 ENCOUNTER — Emergency Department (HOSPITAL_COMMUNITY): Payer: Managed Care, Other (non HMO)

## 2016-11-11 ENCOUNTER — Emergency Department (HOSPITAL_COMMUNITY)
Admission: EM | Admit: 2016-11-11 | Discharge: 2016-11-11 | Disposition: A | Discharge status: Home / Self Care | Payer: Managed Care, Other (non HMO) | Attending: Emergency Medicine | Admitting: Emergency Medicine

## 2016-11-11 ENCOUNTER — Encounter (HOSPITAL_COMMUNITY): Payer: Self-pay | Admitting: Emergency Medicine

## 2016-11-11 DIAGNOSIS — Z7984 Long term (current) use of oral hypoglycemic drugs: Secondary | ICD-10-CM | POA: Diagnosis not present

## 2016-11-11 DIAGNOSIS — Z79899 Other long term (current) drug therapy: Secondary | ICD-10-CM | POA: Insufficient documentation

## 2016-11-11 DIAGNOSIS — F17200 Nicotine dependence, unspecified, uncomplicated: Secondary | ICD-10-CM | POA: Insufficient documentation

## 2016-11-11 DIAGNOSIS — E119 Type 2 diabetes mellitus without complications: Secondary | ICD-10-CM | POA: Diagnosis not present

## 2016-11-11 DIAGNOSIS — M25552 Pain in left hip: Secondary | ICD-10-CM | POA: Insufficient documentation

## 2016-11-11 LAB — CBC WITH DIFFERENTIAL/PLATELET
Basophils Absolute: 0 10*3/uL (ref 0.0–0.1)
Basophils Relative: 0 %
Eosinophils Absolute: 0.1 10*3/uL (ref 0.0–0.7)
Eosinophils Relative: 1 %
HEMATOCRIT: 42.5 % (ref 39.0–52.0)
HEMOGLOBIN: 14.2 g/dL (ref 13.0–17.0)
LYMPHS ABS: 3.5 10*3/uL (ref 0.7–4.0)
LYMPHS PCT: 31 %
MCH: 29.5 pg (ref 26.0–34.0)
MCHC: 33.4 g/dL (ref 30.0–36.0)
MCV: 88.2 fL (ref 78.0–100.0)
MONOS PCT: 8 %
Monocytes Absolute: 0.9 10*3/uL (ref 0.1–1.0)
NEUTROS ABS: 6.6 10*3/uL (ref 1.7–7.7)
NEUTROS PCT: 60 %
Platelets: 254 10*3/uL (ref 150–400)
RBC: 4.82 MIL/uL (ref 4.22–5.81)
RDW: 13.6 % (ref 11.5–15.5)
WBC: 11.1 10*3/uL — ABNORMAL HIGH (ref 4.0–10.5)

## 2016-11-11 LAB — BASIC METABOLIC PANEL
Anion gap: 6 (ref 5–15)
BUN: 9 mg/dL (ref 6–20)
CHLORIDE: 101 mmol/L (ref 101–111)
CO2: 27 mmol/L (ref 22–32)
Calcium: 8.8 mg/dL — ABNORMAL LOW (ref 8.9–10.3)
Creatinine, Ser: 0.79 mg/dL (ref 0.61–1.24)
GFR calc Af Amer: >60 mL/min (ref >60)
GFR calc non Af Amer: >60 mL/min (ref >60)
GLUCOSE: 175 mg/dL — AB (ref 65–99)
Potassium: 3.6 mmol/L (ref 3.5–5.1)
Sodium: 134 mmol/L — ABNORMAL LOW (ref 135–145)

## 2016-11-11 LAB — C-REACTIVE PROTEIN: CRP: 2 mg/dL — ABNORMAL HIGH (ref <1.0)

## 2016-11-11 LAB — SEDIMENTATION RATE: SED RATE: 37 mm/h — AB (ref 0–16)

## 2016-11-11 MED ORDER — NAPROXEN 500 MG PO TABS: 500.0000 mg | ORAL_TABLET | Freq: Two times a day (BID) | ORAL | 0 refills | Status: DC

## 2016-11-11 MED ORDER — KETOROLAC TROMETHAMINE 30 MG/ML IJ SOLN
30.0000 mg | Freq: Once | INTRAMUSCULAR | Status: AC
  Administered 2016-11-11: 30 mg via INTRAVENOUS
  Filled 2016-11-11: qty 1

## 2016-11-11 NOTE — ED Provider Notes (Signed)
[facility] DEPT Provider Note   CSN: [id number] Arrival date & time: 11/11/16  4742     History   Chief Complaint Chief Complaint  Patient presents with  . Hip Pain    HPI Tim Allen is a 41 y.o. male.  41yo M w/ h/o type 2 diabetes mellitus, OSA, morbid obesity who presents with left hip pain. The patient reports gradually worsening left hip pain that began 2 days ago while driving. He is a Administrator by trade. The pain is present at rest but worse with movement. No radiation to back or down leg. No fevers or recent illness. No trauma or change in physical activity. He has taken 2 percocets over the last 12 hours that helped him sleep but did not help with pain. No leg weakness or numbness.   The history is provided by the patient.  Hip Pain     Past Medical History:  Diagnosis Date  . Heavy cigarette smoker   . Morbid obesity ([place])   . Sleep apnea   . Type 2 diabetes mellitus ([place])    new diagnosis with HgB A1c 13.9% on 08/27/16    Patient Active Problem List   Diagnosis Date Noted  . ASCVD (arteriosclerotic cardiovascular disease) 08/28/2016  . New onset type 2 diabetes mellitus ([place]) 08/27/2016  . Morbid obesity ([place]) 08/27/2016  . Smoker 08/27/2016  . OSA on CPAP 09/28/2014  . BMI 45.0-49.9, adult ([place]) 09/28/2014  . Hyperglycemia 09/28/2014    Past Surgical History:  Procedure Laterality Date  . KNEE SURGERY Right age 67   reconstruction after traumatic injury       Home Medications    Prior to Admission medications   Medication Sig Start Date End Date Taking? Authorizing Provider  atorvastatin (LIPITOR) 20 MG tablet Take 1 tablet (20 mg total) by mouth daily. 08/28/16  Yes Henson, [name] L, NP-C  dapagliflozin propanediol (FARXIGA) 5 MG TABS tablet Take 5 mg by mouth daily. 09/10/16  Yes Henson, [name] L, NP-C  Glucose Blood (BLOOD GLUCOSE TEST STRIPS) STRP Test twice a day. Pt uses one touch verio flex meter 08/27/16  Yes Henson, [name] L,  NP-C  ibuprofen (ADVIL,MOTRIN) 200 MG tablet Take 200 mg by mouth every 6 (six) hours as needed for moderate pain.   Yes [provider]  lisinopril (PRINIVIL,ZESTRIL) 5 MG tablet Take 1 tablet (5 mg total) by mouth daily. 10/08/16  Yes Henson, [name] L, NP-C  metFORMIN (GLUCOPHAGE XR) 500 MG 24 hr tablet Take 2 tablets (1,000 mg total) by mouth daily with breakfast. 10/08/16  Yes Henson, [name] L, NP-C  ONETOUCH DELICA LANCETS FINE MISC Test twice a day 08/27/16  Yes Henson, [name] L, NP-C  TRULICITY 5.95 GL/8.7FI SOPN Inject 0.75 mg as directed once a week. Sunday 10/22/16  Yes [provider]  Exenatide ER (BYDUREON BCISE) 2 MG/0.85ML AUIJ Inject 1 pen into the skin once a week. Patient not taking: Reported on 11/11/2016 09/12/16   Harland Dingwall L, NP-C  Exenatide ER (BYDUREON BCISE) 2 MG/0.85ML AUIJ Inject 1 pen into the skin once a week. Patient not taking: Reported on 11/11/2016 09/12/16   Girtha Rm, NP-C  naproxen (NAPROSYN) 500 MG tablet Take 1 tablet (500 mg total) by mouth 2 (two) times daily with a meal. 11/11/16   Little, Wenda Overland, MD    Family History Family History  Problem Relation Age of Onset  . Diabetes Father     Social History Social History  Substance Use  Topics  . Smoking status: Current Every Day Smoker    Packs/day: 2.00    Years: 10.00  . Smokeless tobacco: Never Used  . Alcohol use No     Allergies   Patient has no known allergies.   Review of Systems Review of Systems All other systems reviewed and are negative except that which was mentioned in HPI  Physical Exam Updated Vital Signs BP 126/87 (BP Location: Right Arm)   Pulse 88   Temp 98.5 F (36.9 C) (Oral)   Resp 18   SpO2 98%   Physical Exam  Constitutional: He is oriented to person, place, and time. He appears well-developed and well-nourished. No distress.  HENT:  Head: Normocephalic and atraumatic.  Moist mucous membranes  Eyes: Conjunctivae are normal. Pupils  are equal, round, and reactive to light.  Neck: Neck supple.  Cardiovascular: Normal rate, regular rhythm, normal heart sounds and intact distal pulses.   No murmur heard. Pulmonary/Chest: Effort normal and breath sounds normal.  Abdominal: Soft. Bowel sounds are normal. He exhibits no distension. There is no tenderness.  Musculoskeletal: He exhibits no edema.  Range of motion of left hip limited due to pain, pain w/ internal and external rotation; no low back tenderness  Neurological: He is alert and oriented to person, place, and time. No sensory deficit.  Fluent speech  Skin: Skin is warm and dry.  Psychiatric: He has a normal mood and affect. Judgment normal.  Nursing note and vitals reviewed.    ED Treatments / Results  Labs (all labs ordered are listed, but only abnormal results are displayed) Labs Reviewed  BASIC METABOLIC PANEL - Abnormal; Notable for the following:       Result Value   Sodium 134 (*)    Glucose, Bld 175 (*)    Calcium 8.8 (*)    All other components within normal limits  CBC WITH DIFFERENTIAL/PLATELET - Abnormal; Notable for the following:    WBC 11.1 (*)    All other components within normal limits  C-REACTIVE PROTEIN - Abnormal; Notable for the following:    CRP 2.0 (*)    All other components within normal limits  SEDIMENTATION RATE - Abnormal; Notable for the following:    Sed Rate 37 (*)    All other components within normal limits    EKG  EKG Interpretation None       Radiology Dg Hip Unilat With Pelvis 2-3 Views Left  Result Date: 11/11/2016 CLINICAL DATA:  Left hip pain.  No known injury. EXAM: DG HIP (WITH OR WITHOUT PELVIS) 2-3V LEFT COMPARISON:  None. FINDINGS: Hip joints and SI joints are symmetric and unremarkable. No acute bony abnormality. Specifically, no fracture, subluxation, or dislocation. Soft tissues are intact. IMPRESSION: No acute bony abnormality. Electronically Signed   By: Rolm Baptise M.D.   On: 11/11/2016 09:21     Procedures Procedures (including critical care time)  Medications Ordered in ED Medications  ketorolac (TORADOL) 30 MG/ML injection 30 mg (30 mg Intravenous Given 11/11/16 1001)     Initial Impression / Assessment and Plan / ED Course  I have reviewed the triage vital signs and the nursing notes.  Pertinent labs & imaging results that were available during my care of the patient were reviewed by me and considered in my medical decision making (see chart for details).     Pt w/ a few days of Atraumatic left hip pain. He was neurovascularly intact distally, afebrile with normal vital signs. No skin changes.  X-ray of hip is unremarkable without effusion, bony changes, or acute trauma. Because his pain has not resulted from increased physical activity or trauma, I did obtain labs to evaluate for signs of infection although septic joint in him would be extremely unlikely. WBC 11.1, CRP 20, ESR 37. His labs are not significantly elevated as one would right with osteomyelitis or septic joint. Patient resting comfortably after receiving Toradol. I provided him with orthopedics follow-up information and extensively reviewed return precautions with him including any signs of infection or neurovascular compromise. He is not appear to have any related back pain. It is possible that his symptoms are related to sitting frequently for his job. I discussed supportive measures and patient discharged in satisfactory condition.  Final Clinical Impressions(s) / ED Diagnoses   Final diagnoses:  Left hip pain    New Prescriptions Discharge Medication List as of 11/11/2016 12:29 PM    START taking these medications   Details  naproxen (NAPROSYN) 500 MG tablet Take 1 tablet (500 mg total) by mouth 2 (two) times daily with a meal., Starting Sun 11/11/2016, Print         Little, Wenda Overland, MD 11/11/16 1743

## 2016-11-11 NOTE — ED Triage Notes (Signed)
Patient in with complaints of left hip pain. Denies trauma. Pain 7/10. Ambulatory.

## 2016-11-11 NOTE — Discharge Instructions (Signed)
RETURN TO ER IF WORSENING PAIN, LEG NUMBNESS/WEAKNESS, OR FEVER. FOLLOW UP WITH ORTHOPEDIC CLINIC.

## 2016-11-20 ENCOUNTER — Other Ambulatory Visit: Payer: Self-pay | Admitting: Family Medicine

## 2016-11-20 DIAGNOSIS — IMO0001 Reserved for inherently not codable concepts without codable children: Secondary | ICD-10-CM

## 2016-11-20 DIAGNOSIS — E1165 Type 2 diabetes mellitus with hyperglycemia: Principal | ICD-10-CM

## 2016-12-09 ENCOUNTER — Other Ambulatory Visit: Payer: Self-pay | Admitting: Family Medicine

## 2016-12-09 DIAGNOSIS — E119 Type 2 diabetes mellitus without complications: Secondary | ICD-10-CM

## 2016-12-10 ENCOUNTER — Encounter: Payer: Self-pay | Admitting: Family Medicine

## 2016-12-10 ENCOUNTER — Other Ambulatory Visit: Payer: Self-pay | Admitting: Family Medicine

## 2016-12-10 DIAGNOSIS — E119 Type 2 diabetes mellitus without complications: Secondary | ICD-10-CM

## 2016-12-24 ENCOUNTER — Encounter (HOSPITAL_BASED_OUTPATIENT_CLINIC_OR_DEPARTMENT_OTHER): Payer: Managed Care, Other (non HMO)

## 2017-01-21 ENCOUNTER — Ambulatory Visit (HOSPITAL_BASED_OUTPATIENT_CLINIC_OR_DEPARTMENT_OTHER): Payer: Managed Care, Other (non HMO) | Attending: Family Medicine | Admitting: Internal Medicine

## 2017-01-21 DIAGNOSIS — G4733 Obstructive sleep apnea (adult) (pediatric): Secondary | ICD-10-CM | POA: Insufficient documentation

## 2017-01-23 ENCOUNTER — Other Ambulatory Visit (HOSPITAL_BASED_OUTPATIENT_CLINIC_OR_DEPARTMENT_OTHER): Payer: Self-pay

## 2017-01-23 DIAGNOSIS — G4700 Insomnia, unspecified: Secondary | ICD-10-CM

## 2017-01-23 DIAGNOSIS — G4733 Obstructive sleep apnea (adult) (pediatric): Secondary | ICD-10-CM

## 2017-01-23 DIAGNOSIS — Z9989 Dependence on other enabling machines and devices: Principal | ICD-10-CM

## 2017-01-26 DIAGNOSIS — G4733 Obstructive sleep apnea (adult) (pediatric): Secondary | ICD-10-CM | POA: Diagnosis not present

## 2017-01-26 NOTE — Procedures (Signed)
   Patient Name: Tim Allen, Tim Allen Date: 01/21/2017 Gender: Male D.O.B: 16-Jul-1975 Age (years): 16 Referring Provider: Girtha Rm NP Height (inches): 75 Interpreting Physician: Baird Lyons MD, ABSM Weight (lbs): 317 RPSGT: Jacolyn Reedy BMI: 42 MRN: [id number] Neck Size: 22.00 CLINICAL INFORMATION Sleep Study Type:unattended  HST  Indication for sleep study: OSA  Epworth Sleepiness Score: 3  SLEEP STUDY TECHNIQUE A multi-channel overnight portable sleep study was performed. The channels recorded were: nasal airflow, thoracic respiratory movement, and oxygen saturation with a pulse oximetry. Snoring was also monitored.  MEDICATIONS Patient self administered medications include: none reported.  SLEEP ARCHITECTURE Patient was studied for 371.5 minutes. The sleep efficiency was 100.0 % and the patient was supine for 0%. The arousal index was 0.0 per hour.  RESPIRATORY PARAMETERS The overall AHI was 27.1 per hour, with a central apnea index of 0.0 per hour.  The oxygen nadir was 86% during sleep.  CARDIAC DATA Mean heart rate during sleep was 97.1 bpm.  IMPRESSIONS - Moderate obstructive sleep apnea occurred during this study (AHI = 27.1/h). - No significant central sleep apnea occurred during this study (CAI = 0.0/h). - Moderate oxygen desaturation was noted during this study (Min O2 = 86%, Mean 94%). - Patient snored.  DIAGNOSIS - Obstructive Sleep Apnea (327.23 [G47.33 ICD-10])  RECOMMENDATIONS - Recommend CPAP titration. Other studies would be based on clinical judgment - Avoid alcohol, sedatives and other CNS depressants that may worsen sleep apnea and disrupt normal sleep architecture. - Sleep hygiene should be reviewed to assess factors that may improve sleep quality. - Weight management and regular exercise should be initiated or continued.  [Electronically signed] 01/26/2017 11:29 AM  Baird Lyons MD, ABSM Diplomate, American Board of  Sleep Medicine   NPI: [id number]  [place], American Board of Sleep Medicine  ELECTRONICALLY SIGNED ON:  01/26/2017, 11:27 AM [place] PH: [phone]   FX: [phone] [place]

## 2017-01-28 ENCOUNTER — Other Ambulatory Visit: Payer: Self-pay | Admitting: Family Medicine

## 2017-01-28 ENCOUNTER — Encounter: Payer: Self-pay | Admitting: Internal Medicine

## 2017-01-28 DIAGNOSIS — G4733 Obstructive sleep apnea (adult) (pediatric): Secondary | ICD-10-CM

## 2017-02-02 ENCOUNTER — Other Ambulatory Visit: Payer: Self-pay | Admitting: Family Medicine

## 2017-02-02 DIAGNOSIS — E119 Type 2 diabetes mellitus without complications: Secondary | ICD-10-CM

## 2017-02-04 NOTE — Telephone Encounter (Signed)
Pt was told by mychart to follow-up in a month regarding sleep test and DM. Nothing has been scheduled yet. I will send in 30 days

## 2017-02-12 ENCOUNTER — Encounter: Payer: Self-pay | Admitting: Family Medicine

## 2017-02-26 ENCOUNTER — Telehealth: Payer: Self-pay | Admitting: Internal Medicine

## 2017-02-26 NOTE — Telephone Encounter (Signed)
Pt has decided to go with [facility] for his CPAP supplies.

## 2017-03-06 ENCOUNTER — Ambulatory Visit (INDEPENDENT_AMBULATORY_CARE_PROVIDER_SITE_OTHER): Payer: Managed Care, Other (non HMO) | Admitting: Family Medicine

## 2017-03-06 ENCOUNTER — Encounter: Payer: Self-pay | Admitting: Family Medicine

## 2017-03-06 VITALS — BP 122/80 | HR 72 | Ht 72.75 in | Wt 318.8 lb

## 2017-03-06 DIAGNOSIS — E118 Type 2 diabetes mellitus with unspecified complications: Secondary | ICD-10-CM

## 2017-03-06 DIAGNOSIS — Z6841 Body Mass Index (BMI) 40.0 and over, adult: Secondary | ICD-10-CM | POA: Diagnosis not present

## 2017-03-06 DIAGNOSIS — G4733 Obstructive sleep apnea (adult) (pediatric): Secondary | ICD-10-CM | POA: Diagnosis not present

## 2017-03-06 DIAGNOSIS — F17200 Nicotine dependence, unspecified, uncomplicated: Secondary | ICD-10-CM | POA: Diagnosis not present

## 2017-03-06 DIAGNOSIS — IMO0001 Reserved for inherently not codable concepts without codable children: Secondary | ICD-10-CM

## 2017-03-06 DIAGNOSIS — I2510 Atherosclerotic heart disease of native coronary artery without angina pectoris: Secondary | ICD-10-CM | POA: Diagnosis not present

## 2017-03-06 DIAGNOSIS — Z9989 Dependence on other enabling machines and devices: Secondary | ICD-10-CM | POA: Diagnosis not present

## 2017-03-06 DIAGNOSIS — E119 Type 2 diabetes mellitus without complications: Secondary | ICD-10-CM

## 2017-03-06 DIAGNOSIS — E1165 Type 2 diabetes mellitus with hyperglycemia: Secondary | ICD-10-CM

## 2017-03-06 DIAGNOSIS — IMO0002 Reserved for concepts with insufficient information to code with codable children: Secondary | ICD-10-CM

## 2017-03-06 LAB — COMPLETE METABOLIC PANEL WITH GFR
AG Ratio: 1.5 (calc) (ref 1.0–2.5)
ALBUMIN MSPROF: 4.3 g/dL (ref 3.6–5.1)
ALKALINE PHOSPHATASE (APISO): 64 U/L (ref 40–115)
ALT: 28 U/L (ref 9–46)
AST: 21 U/L (ref 10–40)
BILIRUBIN TOTAL: 0.3 mg/dL (ref 0.2–1.2)
BUN: 11 mg/dL (ref 7–25)
CO2: 25 mmol/L (ref 20–32)
Calcium: 9.8 mg/dL (ref 8.6–10.3)
Chloride: 103 mmol/L (ref 98–110)
Creat: 0.87 mg/dL (ref 0.60–1.35)
GFR, Est African American: 124 mL/min/{1.73_m2} (ref >=60)
GFR, Est Non African American: 107 mL/min/{1.73_m2} (ref >=60)
GLUCOSE: 200 mg/dL — AB (ref 65–99)
Globulin: 2.9 g/dL (calc) (ref 1.9–3.7)
POTASSIUM: 4.3 mmol/L (ref 3.5–5.3)
Sodium: 136 mmol/L (ref 135–146)
Total Protein: 7.2 g/dL (ref 6.1–8.1)

## 2017-03-06 LAB — CBC WITH DIFFERENTIAL/PLATELET
Basophils Absolute: 80 cells/uL (ref 0–200)
Basophils Relative: 1 %
Eosinophils Absolute: 112 cells/uL (ref 15–500)
Eosinophils Relative: 1.4 %
HEMATOCRIT: 46.5 % (ref 38.5–50.0)
HEMOGLOBIN: 15.9 g/dL (ref 13.2–17.1)
LYMPHS ABS: 3184 {cells}/uL (ref 850–3900)
MCH: 28.8 pg (ref 27.0–33.0)
MCHC: 34.2 g/dL (ref 32.0–36.0)
MCV: 84.2 fL (ref 80.0–100.0)
MPV: 10.1 fL (ref 7.5–12.5)
Monocytes Relative: 11.7 %
NEUTROS ABS: 3688 {cells}/uL (ref 1500–7800)
Neutrophils Relative %: 46.1 %
Platelets: 237 10*3/uL (ref 140–400)
RBC: 5.52 10*6/uL (ref 4.20–5.80)
RDW: 13.5 % (ref 11.0–15.0)
Total Lymphocyte: 39.8 %
WBC: 8 10*3/uL (ref 3.8–10.8)
WBCMIX: 936 {cells}/uL (ref 200–950)

## 2017-03-06 LAB — LIPID PANEL
CHOL/HDL RATIO: 4.7 (calc) (ref <5.0)
CHOLESTEROL: 141 mg/dL (ref <200)
HDL: 30 mg/dL — ABNORMAL LOW (ref >40)
LDL CHOLESTEROL (CALC): 83 mg/dL
NON-HDL CHOLESTEROL (CALC): 111 mg/dL (ref <130)
Triglycerides: 187 mg/dL — ABNORMAL HIGH (ref <150)

## 2017-03-06 LAB — POCT GLYCOSYLATED HEMOGLOBIN (HGB A1C): Hemoglobin A1C: 8.2

## 2017-03-06 MED ORDER — METFORMIN HCL ER 500 MG PO TB24: ORAL_TABLET | ORAL | 2 refills | Status: DC

## 2017-03-06 MED ORDER — DAPAGLIFLOZIN PROPANEDIOL 5 MG PO TABS: 5.0000 mg | ORAL_TABLET | Freq: Every day | ORAL | 2 refills | Status: DC

## 2017-03-06 MED ORDER — ATORVASTATIN CALCIUM 20 MG PO TABS: 20.0000 mg | ORAL_TABLET | Freq: Every day | ORAL | 2 refills | Status: DC

## 2017-03-06 MED ORDER — LISINOPRIL 5 MG PO TABS: 5.0000 mg | ORAL_TABLET | Freq: Every day | ORAL | 2 refills | Status: DC

## 2017-03-06 NOTE — Progress Notes (Signed)
Subjective:    Patient ID: Tim Allen, male    DOB: 01-Sep-1975, 41 y.o.   MRN: [id number]  Tim Allen is a 41 y.o. male who presents for follow-up of Type 2 diabetes mellitus.  States he just got his new CPAP last week and started using it this week. Reports feeling tired and he his sleep pattern has been off lately. Reports that he is looking for a new job. 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   A1c 8.2%  States he has not been taking Trulicity for the past 2 months because he "forgets".   States he has cut back on smoking over the past few weeks and has gone from 2 ppd to 1 ppd.   Patient is checking home blood sugars.   Home blood sugar records: between 90 to 200. No low blood sugars.  How often is blood sugars being checked: twice daily  Current symptoms include: none. Patient denies nausea, visual disturbances, vomiting and weight loss.  Patient is checking their feet daily. Any Foot concerns (callous, ulcer, wound, thickened nails, toenail fungus, skin fungus, hammer toe): none Last dilated eye exam: never   Current treatments: none. Medication compliance: good  Current diet: in general, a "healthy" diet   Current exercise: walk 3 times a week Known diabetic complications: no known  The following portions of the patient's history were reviewed and updated as appropriate: allergies, current medications, past medical history, past social history and problem list.  ROS as in subjective above.     Objective:    Physical Exam Alert and in no distress otherwise not examined.  Blood pressure 122/80, pulse 72, height 6' 0.75" (1.848 m), weight (!) 318 lb 12.8 oz (144.6 kg).  Lab Review Diabetic Labs Latest Ref Rng & Units 03/06/2017 11/11/2016 10/09/2016 08/27/2016  HbA1c - 8.2% - 9.7% 13.9%  Microalbumin Not estab mg/dL - - - 0.6  Micro/Creat Ratio <30 mcg/mg creat - - - 3  Chol <200 mg/dL - -  - 159  HDL >40 mg/dL - - - 28(L)  Calc LDL <100 mg/dL - - - 92  Triglycerides <150 mg/dL - - - 194(H)  Creatinine 0.61 - 1.24 mg/dL - 0.79 - 0.78   BP/Weight 03/06/2017 01/21/2017 11/11/2016 6/73/4193 01/01/239  Systolic BP 973 - 532 992 -  Diastolic BP 80 - 87 90 -  Wt. (Lbs) 318.8 317 - 321.4 317  BMI 42.35 41.82 - 42.4 41.82   No flowsheet data found.  Tiger  reports that he has been smoking.  He has a 20.00 pack-year smoking history. He has never used smokeless tobacco. He reports that he does not drink alcohol or use drugs.     Assessment & Plan:    Uncontrolled type 2 diabetes mellitus with complication, without long-term current use of insulin ([place]) - Plan: HgB A1c, CBC with Differential/Platelet, COMPLETE METABOLIC PANEL WITH GFR, Lipid panel  BMI 45.0-49.9, adult ([facility]) - Plan: CBC with Differential/Platelet, COMPLETE METABOLIC PANEL WITH GFR, Lipid panel  Smoker  OSA on CPAP  ASCVD (arteriosclerotic cardiovascular disease) - Plan: Lipid panel  Uncontrolled type 2 diabetes mellitus without complication, without long-term current use of insulin ([place])  New onset type 2 diabetes mellitus ([place])  1. Rx changes: none 2. Education: Reviewed 'ABCs' of diabetes management (respective goals in parentheses):  A1C (<7), blood pressure (<130/80), and cholesterol (LDL <100). 3. Compliance at present is  estimated to be fair. Efforts to improve compliance (if necessary) will be directed at dietary modifications: continue to cut back on carbohydrates, increased exercise and regular blood sugar monitoring: two times daily. 4. Counseled on weight loss.  5. Will check fasting lipids. Continue statin.  6. Congratulated him on cutting back on smoking and encouraged him to continue.  7. Recommend increasing physical activity.  8. Continue using daily CPAP and try to get on a regular sleep schedule.  9. Follow up: 3 months  10. Recommend he get a diabetic eye exam.

## 2017-03-06 NOTE — Patient Instructions (Signed)
Start back on Trulicity.   Call and schedule a diabetic eye exam.   Continue checking your blood sugars.   Good job on cutting back on smoking. Continue to do this.   Follow up in 3 months.

## 2017-03-17 ENCOUNTER — Telehealth: Payer: Self-pay | Admitting: Family Medicine

## 2017-03-17 NOTE — Telephone Encounter (Addendum)
Recv'd fax for P.A. TOUJEO, doesn't look like pt is still on Toujeo, looks like was switched to Trulicity.  [name] is pt supposed to still be on Toujeo?

## 2017-03-18 NOTE — Telephone Encounter (Signed)
He is no longer on Toujeo. He is taking Trulicity. Thanks.

## 2017-05-05 ENCOUNTER — Other Ambulatory Visit: Payer: Self-pay | Admitting: Family Medicine

## 2017-05-05 DIAGNOSIS — E1165 Type 2 diabetes mellitus with hyperglycemia: Principal | ICD-10-CM

## 2017-05-05 DIAGNOSIS — IMO0001 Reserved for inherently not codable concepts without codable children: Secondary | ICD-10-CM

## 2017-06-05 ENCOUNTER — Encounter: Payer: Self-pay | Admitting: Family Medicine

## 2017-06-05 ENCOUNTER — Other Ambulatory Visit: Payer: Self-pay

## 2017-06-05 ENCOUNTER — Ambulatory Visit (INDEPENDENT_AMBULATORY_CARE_PROVIDER_SITE_OTHER): Payer: Managed Care, Other (non HMO) | Admitting: Family Medicine

## 2017-06-05 VITALS — BP 136/82 | HR 111 | Wt 323.0 lb

## 2017-06-05 DIAGNOSIS — Z6841 Body Mass Index (BMI) 40.0 and over, adult: Secondary | ICD-10-CM

## 2017-06-05 DIAGNOSIS — E119 Type 2 diabetes mellitus without complications: Secondary | ICD-10-CM

## 2017-06-05 DIAGNOSIS — F17200 Nicotine dependence, unspecified, uncomplicated: Secondary | ICD-10-CM

## 2017-06-05 LAB — POCT GLYCOSYLATED HEMOGLOBIN (HGB A1C): Hemoglobin A1C: 7.2

## 2017-06-05 MED ORDER — BLOOD GLUCOSE TEST VI STRP: ORAL_STRIP | 4 refills | Status: DC

## 2017-06-05 MED ORDER — ONETOUCH DELICA LANCETS FINE MISC: 5 refills | Status: DC

## 2017-06-05 NOTE — Progress Notes (Signed)
Subjective:    Patient ID: Tim Allen, male    DOB: 19-Oct-1975, 41 y.o.   MRN: [id number]  Tim Allen is a 41 y.o. male who presents for follow-up of Type 2 diabetes mellitus.  States he is doing well and has a new job working for the city.  He is taking his medication around lunch time.  States he takes his Trulicity on Sundays.   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   Needs CPAP read.  States he has someone that can do this.  States he is using his CPAP nightly.  Sleeping well  Smoking less than pack per day.    Patient is checking home blood sugars.   Home blood sugar records: Readings are consistent with his A1c but he rarely checks blood sugars. How often is blood sugars being checked: 3 times per month Current symptoms include: none. Patient denies hyperglycemia, hypoglycemia , nausea, paresthesia of the feet, polydipsia, polyuria, visual disturbances, vomiting and weight loss.  Patient is checking their feet daily. Any Foot concerns (callous, ulcer, wound, thickened nails, toenail fungus, skin fungus, hammer toe): none  Last dilated eye exam: never   Current treatments: Metformin, first [employer], Trulicity. Medication compliance: good  Current diet: fast food often Current exercise: none Known diabetic complications: none  The following portions of the patient's history were reviewed and updated as appropriate: allergies, current medications, past medical history, past social history and problem list.  ROS as in subjective above.     Objective:    Physical Exam Alert and in no distress otherwise not examined.  Blood pressure 136/82, pulse (!) 111, weight (!) 323 lb (146.5 kg), SpO2 95 %.  Lab Review Diabetic Labs Latest Ref Rng & Units 06/05/2017 03/06/2017 11/11/2016 10/09/2016 08/27/2016  HbA1c - 7.2 8.2% - 9.7% 13.9%  Microalbumin Not estab mg/dL - - - - 0.6  Micro/Creat Ratio <30 mcg/mg creat - - - - 3  Chol <200 mg/dL - 141 - - 159  HDL >40 mg/dL -  30(L) - - 28(L)  Calc LDL <100 mg/dL - - - - 92  Triglycerides <150 mg/dL - 187(H) - - 194(H)  Creatinine 0.60 - 1.35 mg/dL - 0.87 0.79 - 0.78   BP/Weight 06/05/2017 03/06/2017 01/21/2017 11/11/2016 6/41/5830  Systolic BP 940 768 - 088 110  Diastolic BP 82 80 - 87 90  Wt. (Lbs) 323 318.8 317 - 321.4  BMI 42.91 42.35 41.82 - 42.4   Foot/eye exam completion dates 06/05/2017  Foot Form Completion Done    Tim Allen  reports that he has been smoking.  He has a 20.00 pack-year smoking history. he has never used smokeless tobacco. He reports that he does not drink alcohol or use drugs.     Assessment & Plan:    Controlled type 2 diabetes mellitus without complication, without long-term current use of insulin ([place]) - Plan: HgB A1c  Morbid obesity ([place])  Smoker  BMI 45.0-49.9, adult ([place])  1. Rx changes: none he is doing well on his current medication regimen and he will continue.   Hemoglobin A1c 7.2% today and very close to goal range.  Trulicity samples given 2 pens.  2. Education: Reviewed 'ABCs' of diabetes management (respective goals in parentheses):  A1C (<7), blood pressure (<130/80), and cholesterol (LDL <100). 3. Discussed that he has gained 5 pounds since his last visit and recommend that he cut back on calories and increase physical activity.   4. He has cut back significantly on  smoking.  Encouraged him to stop smoking  5. foot exam done and normal. 6. Compliance at present is estimated to be good. Efforts to improve compliance (if necessary) will be directed at dietary modifications: Continue to cut back on sugary foods, drinks and carbohydrates and increased exercise. 7. Follow up: Mid March for fasting CPE and diabetes check  8. He agrees to get a diabetic eye exam.  He will call and schedule this.

## 2017-06-05 NOTE — Patient Instructions (Addendum)
Your hemoglobin A1c today is 7.2%   Call and schedule your diabetic eye exam.   Follow up FASTING for your annual physical and diabetes check after March 5th 2019.

## 2017-06-12 ENCOUNTER — Other Ambulatory Visit: Payer: Self-pay | Admitting: Family Medicine

## 2017-06-12 DIAGNOSIS — E1165 Type 2 diabetes mellitus with hyperglycemia: Principal | ICD-10-CM

## 2017-06-12 DIAGNOSIS — IMO0001 Reserved for inherently not codable concepts without codable children: Secondary | ICD-10-CM

## 2017-07-05 ENCOUNTER — Other Ambulatory Visit: Payer: Self-pay | Admitting: Family Medicine

## 2017-07-05 DIAGNOSIS — E119 Type 2 diabetes mellitus without complications: Secondary | ICD-10-CM

## 2017-08-01 ENCOUNTER — Encounter: Payer: Self-pay | Admitting: Family Medicine

## 2017-08-12 ENCOUNTER — Other Ambulatory Visit: Payer: Self-pay | Admitting: Family Medicine

## 2017-08-12 DIAGNOSIS — E119 Type 2 diabetes mellitus without complications: Secondary | ICD-10-CM

## 2017-08-28 ENCOUNTER — Encounter: Payer: Managed Care, Other (non HMO) | Admitting: Family Medicine

## 2017-09-08 NOTE — Progress Notes (Signed)
Subjective:    Patient ID: Tim Allen, male    DOB: 1976/06/04, 42 y.o.   MRN: [id number]  HPI Chief Complaint  Patient presents with  . fasting cpe    fasting cpe, no concern. waiting for deductible to be met before going to DM eye exam   He is here for a complete physical exam.  Diabetes- is not checking his blood sugar Trulicity - ran out 6 weeks ago and did not let me know.   Reports he is taking daily Metformin 1,000 mg and Farxiga as well as a statin.   OSA- on CPAP.  States he uses this nightly and even daytime when he naps.  States he needs this.  He has no new concerns today.  I have asked him on several occasions to get a diabetic eye exam but he has not.  No obvious reason as to why he did not.  He is still smoking 1 ppd and not ready to quit.   States his diet is unhealthy He does not exercise.  Social history: Lives with his wife and children, works as a Engineer, drilling.  Denies drug use.  States he does not drink alcohol very often.  Immunizations: Tdap up to date.   Health maintenance:  Colonoscopy: Never Last PSA: Never Last Dental Exam: Overdue Last Eye Exam: Overdue  States he does not always wear his seatbelt, smoke detectors in home and functioning, does not text while driving.   Reviewed allergies, medications, past medical, surgical, family, and social history.   Review of Systems Review of Systems Constitutional: -fever, -chills, -sweats, -unexpected weight change,+fatigue ENT: -runny nose, -ear pain, -sore throat Cardiology:  -chest pain, -palpitations, -edema Respiratory: -cough, -shortness of breath, -wheezing Gastroenterology: -abdominal pain, -nausea, -vomiting, -diarrhea, -constipation  Hematology: -bleeding or bruising problems Musculoskeletal: -arthralgias, -myalgias, -joint swelling, -back pain Ophthalmology: -vision changes Urology: -dysuria, -difficulty urinating, -hematuria, -urinary frequency, -urgency Neurology:  -headache, -weakness, -tingling, -numbness       Objective:   Physical Exam BP 120/78   Pulse 80   Ht 6' 1.75" (1.873 m)   Wt (!) 325 lb (147.4 kg)   BMI 42.01 kg/m   General Appearance:    Alert, cooperative, no distress, appears stated age  Head:    Normocephalic, without obvious abnormality, atraumatic  Eyes:    PERRL, conjunctiva/corneas clear, EOM's intact, fundi    benign  Ears:    Normal TM's and external ear canals  Nose:   Nares normal, mucosa normal, no drainage or sinus   tenderness  Throat:   Lips, mucosa, and tongue normal; teeth and gums normal  Neck:   Supple, no lymphadenopathy;  thyroid:  no   enlargement/tenderness/nodules; no carotid   bruit or JVD  Back:    Spine nontender, no curvature, ROM normal, no CVA     tenderness  Lungs:     Clear to auscultation bilaterally without wheezes, rales or     ronchi; respirations unlabored  Chest Wall:    No tenderness or deformity   Heart:    Regular rate and rhythm, S1 and S2 normal, no murmur, rub   or gallop  Breast Exam:    No chest wall tenderness, masses, mild gynecomastia  Abdomen:     Soft, non-tender, nondistended, normoactive bowel sounds,    no masses, no hepatosplenomegaly  Genitalia:    Refuses   Rectal:    Refuses   Extremities:   No clubbing, cyanosis or edema  Pulses:  2+ and symmetric all extremities  Skin:   Skin color, texture, turgor normal, no rashes or lesions  Lymph nodes:   Cervical, supraclavicular, and axillary nodes normal  Neurologic:   CNII-XII intact, normal strength, sensation and gait; reflexes 2+ and symmetric throughout          Psych:   Normal mood, affect, hygiene and grooming.    Urinalysis dipstick: He did not leave a specimen.       Assessment & Plan:  Routine general medical examination at a health care facility - Plan: CBC with Differential/Platelet, Comprehensive metabolic panel, POCT Urinalysis DIP (Proadvantage Device), TSH, Lipid panel  Morbid obesity ([facility]) - Plan:  TSH  Smoker  Controlled type 2 diabetes mellitus without complication, without long-term current use of insulin ([place]) - Plan: HgB A1c, Microalbumin / creatinine urine ratio, TSH, Lipid panel  OSA on CPAP  ASCVD (arteriosclerotic cardiovascular disease) - Plan: Lipid panel  He did not leave a urine specimen today.  I verbalized that I wanted to check his urine along with his microalbumin. Discussed that his diabetes appears to be well controlled even without Trulicity for the past 6 weeks. Hgb A1c 7.1%  Once again I asked him to get a diabetic eye exam. Foot exam done and no obvious abnormalities. Counseling done on smoking cessation and weight loss. He is using his CPAP nightly for OSA and is benefiting from this. He is fasting and we will check a lipid panel.  He will continue on statin.  We will adjust dose if needed. Follow up pending labs or in 3 months.

## 2017-09-09 ENCOUNTER — Ambulatory Visit (INDEPENDENT_AMBULATORY_CARE_PROVIDER_SITE_OTHER): Payer: Managed Care, Other (non HMO) | Admitting: Family Medicine

## 2017-09-09 ENCOUNTER — Encounter: Payer: Self-pay | Admitting: Family Medicine

## 2017-09-09 VITALS — BP 120/78 | HR 80 | Ht 73.75 in | Wt 325.0 lb

## 2017-09-09 DIAGNOSIS — G4733 Obstructive sleep apnea (adult) (pediatric): Secondary | ICD-10-CM

## 2017-09-09 DIAGNOSIS — I2510 Atherosclerotic heart disease of native coronary artery without angina pectoris: Secondary | ICD-10-CM

## 2017-09-09 DIAGNOSIS — Z0000 Encounter for general adult medical examination without abnormal findings: Secondary | ICD-10-CM | POA: Diagnosis not present

## 2017-09-09 DIAGNOSIS — Z9989 Dependence on other enabling machines and devices: Secondary | ICD-10-CM

## 2017-09-09 DIAGNOSIS — E119 Type 2 diabetes mellitus without complications: Secondary | ICD-10-CM | POA: Diagnosis not present

## 2017-09-09 DIAGNOSIS — F17200 Nicotine dependence, unspecified, uncomplicated: Secondary | ICD-10-CM

## 2017-09-09 LAB — POCT GLYCOSYLATED HEMOGLOBIN (HGB A1C): Hemoglobin A1C: 7.1

## 2017-09-09 NOTE — Patient Instructions (Signed)
Your hemoglobin A1c is 7.1% today and this is in goal range.  If you want to start back on the Trulicity once your co-pay is met then let me know.   Cut back on portion sizes, sweets, breads, rice, potatoes, pasta. Try to avoid drinking sugary drinks.  Get at least 150 minutes of physical activity per week outside of your normal daily schedule.   Stop smoking! You will benefit from this.   Please get your diabetic eye exam.   Preventative Care for Adults, Male       [place]:  A routine yearly physical is a good way to check in with your primary care provider about your health and preventive screening. It is also an opportunity to share updates about your health and any concerns you have, and receive a thorough all-over exam.   Most health insurance companies pay for at least some preventative services.  Check with your health plan for specific coverages.  WHAT PREVENTATIVE SERVICES DO MEN NEED?  Adult men should have their weight and blood pressure checked regularly.   Men age 32 and older should have their cholesterol levels checked regularly.  Beginning at age 78 and continuing to age 22, men should be screened for colorectal cancer.  Certain people should may need continued testing until age 50.  Other cancer screening may include exams for testicular and prostate cancer.  Updating vaccinations is part of preventative care.  Vaccinations help protect against diseases such as the flu.  Lab tests are generally done as part of preventative care to screen for anemia and blood disorders, to screen for problems with the kidneys and liver, to screen for bladder problems, to check blood sugar, and to check your cholesterol level.  Preventative services generally include counseling about diet, exercise, avoiding tobacco, drugs, excessive alcohol consumption, and sexually transmitted infections.    GENERAL RECOMMENDATIONS FOR GOOD HEALTH:  Healthy diet:  Eat a variety of  foods, including fruit, vegetables, animal or vegetable protein, such as meat, fish, chicken, and eggs, or beans, lentils, tofu, and grains, such as rice.  Drink plenty of water daily.  Decrease saturated fat in the diet, avoid lots of red meat, processed foods, sweets, fast foods, and fried foods.  Exercise:  Aerobic exercise helps maintain good heart health. At least 30-40 minutes of moderate-intensity exercise is recommended. For example, a brisk walk that increases your heart rate and breathing. This should be done on most days of the week.   Find a type of exercise or a variety of exercises that you enjoy so that it becomes a part of your daily life.  Examples are running, walking, swimming, water aerobics, and biking.  For motivation and support, explore group exercise such as aerobic class, spin class, Zumba, Yoga,or  martial arts, etc.    Set exercise goals for yourself, such as a certain weight goal, walk or run in a race such as a 5k walk/run.  Speak to your primary care provider about exercise goals.  Disease prevention:  If you smoke or chew tobacco, find out from your caregiver how to quit. It can literally save your life, no matter how long you have been a tobacco user. If you do not use tobacco, never begin.   Maintain a healthy diet and normal weight. Increased weight leads to problems with blood pressure and diabetes.   The Body Mass Index or BMI is a way of measuring how much of your body is fat. Having a BMI  above 27 increases the risk of heart disease, diabetes, hypertension, stroke and other problems related to obesity. Your caregiver can help determine your BMI and based on it develop an exercise and dietary program to help you achieve or maintain this important measurement at a healthful level.  High blood pressure causes heart and blood vessel problems.  Persistent high blood pressure should be treated with medicine if weight loss and exercise do not work.   Fat and  cholesterol leaves deposits in your arteries that can block them. This causes heart disease and vessel disease elsewhere in your body.  If your cholesterol is found to be high, or if you have heart disease or certain other medical conditions, then you may need to have your cholesterol monitored frequently and be treated with medication.   Ask if you should have a stress test if your history suggests this. A stress test is a test done on a treadmill that looks for heart disease. This test can find disease prior to there being a problem.  Avoid drinking alcohol in excess (more than two drinks per day).  Avoid use of street drugs. Do not share needles with anyone. Ask for professional help if you need assistance or instructions on stopping the use of alcohol, cigarettes, and/or drugs.  Brush your teeth twice a day with fluoride toothpaste, and floss once a day. Good oral hygiene prevents tooth decay and gum disease. The problems can be painful, unattractive, and can cause other health problems. Visit your dentist for a routine oral and dental check up and preventive care every 6-12 months.   Look at your skin regularly.  Use a mirror to look at your back. Notify your caregivers of changes in moles, especially if there are changes in shapes, colors, a size larger than a pencil eraser, an irregular border, or development of new moles.  Safety:  Use seatbelts 100% of the time, whether driving or as a passenger.  Use safety devices such as hearing protection if you work in environments with loud noise or significant background noise.  Use safety glasses when doing any work that could send debris in to the eyes.  Use a helmet if you ride a bike or motorcycle.  Use appropriate safety gear for contact sports.  Talk to your caregiver about gun safety.  Use sunscreen with a SPF (or skin protection factor) of 15 or greater.  Lighter skinned people are at a greater risk of skin cancer. Don't forget to also wear  sunglasses in order to protect your eyes from too much damaging sunlight. Damaging sunlight can accelerate cataract formation.   Practice safe sex. Use condoms. Condoms are used for birth control and to help reduce the spread of sexually transmitted infections (or STIs).  Some of the STIs are gonorrhea (the clap), chlamydia, syphilis, trichomonas, herpes, HPV (human papilloma virus) and HIV (human immunodeficiency virus) which causes AIDS. The herpes, HIV and HPV are viral illnesses that have no cure. These can result in disability, cancer and death.   Keep carbon monoxide and smoke detectors in your home functioning at all times. Change the batteries every 6 months or use a model that plugs into the wall.   Vaccinations:  Stay up to date with your tetanus shots and other required immunizations. You should have a booster for tetanus every 10 years. Be sure to get your flu shot every year, since 5%-20% of the [place] population comes down with the flu. The flu vaccine changes each year, so  being vaccinated once is not enough. Get your shot in the fall, before the flu season peaks.   Other vaccines to consider:  Pneumococcal vaccine to protect against certain types of pneumonia.  This is normally recommended for adults age 50 or older.  However, adults younger than 43 years old with certain underlying conditions such as diabetes, heart or lung disease should also receive the vaccine.  Shingles vaccine to protect against Varicella Zoster if you are older than age 79, or younger than 42 years old with certain underlying illness.  Hepatitis A vaccine to protect against a form of infection of the liver by a virus acquired from food.  Hepatitis B vaccine to protect against a form of infection of the liver by a virus acquired from blood or body fluids, particularly if you work in health care.  If you plan to travel internationally, check with your local health department for specific vaccination  recommendations.  Cancer Screening:  Most routine colon cancer screening begins at the age of 59. On a yearly basis, doctors may provide special easy to use take-home tests to check for hidden blood in the stool. Sigmoidoscopy or colonoscopy can detect the earliest forms of colon cancer and is life saving. These tests use a [place] camera at the end of a tube to directly examine the colon. Speak to your caregiver about this at age 62, when routine screening begins (and is repeated every 5 years unless early forms of pre-cancerous polyps or Vossler growths are found).   At the age of 68 men usually start screening for prostate cancer every year. Screening may begin at a younger age for those with higher risk. Those at higher risk include African-Americans or having a family history of prostate cancer. There are two types of tests for prostate cancer:   Prostate-specific antigen (PSA) testing. Recent studies raise questions about prostate cancer using PSA and you should discuss this with your caregiver.   Digital rectal exam (in which your doctor's lubricated and gloved finger feels for enlargement of the prostate through the anus).   Screening for testicular cancer.  Do a monthly exam of your testicles. Gently roll each testicle between your thumb and fingers, feeling for any abnormal lumps. The best time to do this is after a hot shower or bath when the tissues are looser. Notify your caregivers of any lumps, tenderness or changes in size or shape immediately.

## 2017-09-10 LAB — CBC WITH DIFFERENTIAL/PLATELET
BASOS: 1 %
Basophils Absolute: 0.1 10*3/uL (ref 0.0–0.2)
EOS (ABSOLUTE): 0.1 10*3/uL (ref 0.0–0.4)
Eos: 1 %
HEMOGLOBIN: 15.3 g/dL (ref 13.0–17.7)
Hematocrit: 46.5 % (ref 37.5–51.0)
Immature Grans (Abs): 0 10*3/uL (ref 0.0–0.1)
Immature Granulocytes: 0 %
LYMPHS ABS: 3.3 10*3/uL — AB (ref 0.7–3.1)
Lymphs: 40 %
MCH: 29 pg (ref 26.6–33.0)
MCHC: 32.9 g/dL (ref 31.5–35.7)
MCV: 88 fL (ref 79–97)
Monocytes Absolute: 0.7 10*3/uL (ref 0.1–0.9)
Monocytes: 8 %
NEUTROS ABS: 4.2 10*3/uL (ref 1.4–7.0)
Neutrophils: 50 %
Platelets: 282 10*3/uL (ref 150–379)
RBC: 5.27 x10E6/uL (ref 4.14–5.80)
RDW: 14.2 % (ref 12.3–15.4)
WBC: 8.4 10*3/uL (ref 3.4–10.8)

## 2017-09-10 LAB — COMPREHENSIVE METABOLIC PANEL
A/G RATIO: 1.5 (ref 1.2–2.2)
ALBUMIN: 4.4 g/dL (ref 3.5–5.5)
ALK PHOS: 75 IU/L (ref 39–117)
ALT: 30 IU/L (ref 0–44)
AST: 18 IU/L (ref 0–40)
BILIRUBIN TOTAL: 0.3 mg/dL (ref 0.0–1.2)
BUN / CREAT RATIO: 12 (ref 9–20)
BUN: 10 mg/dL (ref 6–24)
CHLORIDE: 100 mmol/L (ref 96–106)
CO2: 24 mmol/L (ref 20–29)
Calcium: 9.8 mg/dL (ref 8.7–10.2)
Creatinine, Ser: 0.84 mg/dL (ref 0.76–1.27)
GFR calc non Af Amer: 109 mL/min/{1.73_m2} (ref >59)
GFR, EST AFRICAN AMERICAN: 126 mL/min/{1.73_m2} (ref >59)
GLUCOSE: 222 mg/dL — AB (ref 65–99)
Globulin, Total: 2.9 g/dL (ref 1.5–4.5)
POTASSIUM: 4.5 mmol/L (ref 3.5–5.2)
Sodium: 140 mmol/L (ref 134–144)
Total Protein: 7.3 g/dL (ref 6.0–8.5)

## 2017-09-10 LAB — LIPID PANEL
Chol/HDL Ratio: 3.8 ratio (ref 0.0–5.0)
Cholesterol, Total: 134 mg/dL (ref 100–199)
HDL: 35 mg/dL — AB (ref >39)
LDL Calculated: 73 mg/dL (ref 0–99)
Triglycerides: 131 mg/dL (ref 0–149)
VLDL Cholesterol Cal: 26 mg/dL (ref 5–40)

## 2017-09-10 LAB — MICROALBUMIN / CREATININE URINE RATIO
CREATININE, UR: 112.9 mg/dL
MICROALB/CREAT RATIO: 3 mg/g{creat} (ref 0.0–30.0)
MICROALBUM., U, RANDOM: 3.4 ug/mL

## 2017-09-10 LAB — TSH: TSH: 1.56 u[IU]/mL (ref 0.450–4.500)

## 2017-10-07 ENCOUNTER — Other Ambulatory Visit: Payer: Self-pay | Admitting: Family Medicine

## 2017-11-11 ENCOUNTER — Other Ambulatory Visit: Payer: Self-pay | Admitting: Family Medicine

## 2017-11-11 DIAGNOSIS — IMO0001 Reserved for inherently not codable concepts without codable children: Secondary | ICD-10-CM

## 2017-11-11 DIAGNOSIS — E1165 Type 2 diabetes mellitus with hyperglycemia: Principal | ICD-10-CM

## 2017-12-23 NOTE — Progress Notes (Signed)
Subjective:    Patient ID: Tim Allen, male    DOB: 1975-09-25, 42 y.o.   MRN: [id number]  Tim Allen is a 42 y.o. male who presents for follow-up of Type 2 diabetes mellitus.  Just started back on his medication one month ago. States he just decided to stop all medications for a while. No other explanation.  Currently taking Metformin but has not started back on Trulicity. Requests refill. Takes Wilder Glade some days.   He would like to stop smoking but no plan to do this. He is smoking 2 ppd. States he has tried Wellbutrin, Chantix and nicotine gum in the past. Does not want to try any of these again. States he smokes everywhere, "even in the shower".   Patient are not checking home blood sugars.   Home blood sugar records: patient does not check sugars How often is blood sugars being checked: suppose to be checking daily Current symptoms include: nausea and urinary frequency. Patient denies increased appetite, visual disturbances, vomiting and weight loss.  Patient is checking their feet daily. Any Foot concerns (callous, ulcer, wound, thickened nails, toenail fungus, skin fungus, hammer toe): none Last dilated eye exam: overdue   Current treatments: compliance has not been good.  Medication compliance: poor   Current diet: in general, a "healthy" diet   Current exercise: none Known diabetic complications: none  The following portions of the patient's history were reviewed and updated as appropriate: allergies, current medications, past medical history, past social history and problem list.  ROS as in subjective above.     Objective:    Physical Exam Alert and in no distress otherwise not examined.  Blood pressure 140/80, pulse (!) 103, weight (!) 323 lb (146.5 kg).  Lab Review Diabetic Labs Latest Ref Rng & Units 12/24/2017 09/09/2017 06/05/2017 03/06/2017 11/11/2016  HbA1c 4.0 - 5.6 % 8.6(A) 7.1% 7.2 8.2% -  Microalbumin Not estab mg/dL - - - - -  Micro/Creat Ratio <30  mcg/mg creat - - - - -  Chol 100 - 199 mg/dL - 134 - 141 -  HDL >39 mg/dL - 35(L) - 30(L) -  Calc LDL 0 - 99 mg/dL - 73 - 83 -  Triglycerides 0 - 149 mg/dL - 131 - 187(H) -  Creatinine 0.76 - 1.27 mg/dL - 0.84 - 0.87 0.79   BP/Weight 12/24/2017 09/09/2017 06/05/2017 03/06/2017 10/17/9561  Systolic BP 875 643 329 518 -  Diastolic BP 80 78 82 80 -  Wt. (Lbs) 323 325 323 318.8 317  BMI 41.75 42.01 42.91 42.35 41.82   Foot/eye exam completion dates 06/05/2017  Foot Form Completion Done    Tim Allen  reports that he has been smoking.  He has a 20.00 pack-year smoking history. He has never used smokeless tobacco. He reports that he does not drink alcohol or use drugs.     Assessment & Plan:    Controlled type 2 diabetes mellitus without complication, without long-term current use of insulin ([place]) - Plan: HgB A1c  OSA on CPAP  Morbid obesity ([facility])  Smoker  ASCVD (arteriosclerotic cardiovascular disease)  Personal history of noncompliance with medical treatment, presenting hazards to health  1. Rx changes: Hgb A1c elevated at 8.4%  refilled Trulicity. encouraged him to start back on all medications and take these daily. recommend he set his alarm to remind him.  2. Education: Reviewed 'ABCs' of diabetes management (respective goals in parentheses):  A1C (<7), blood pressure (<130/80), and cholesterol (LDL <100). 3. Compliance at present  is estimated to be poor. Efforts to improve compliance (if necessary) will be directed at dietary modifications: cut back on carbohydrates and sugar, increased exercise and regular blood sugar monitoring: 3-4  times weekly. 4. Smoking cessation counseling done. Recommend 1-800- quit now, counseling and to cut back.  5. Discussed potential worsening health consequences of noncompliance, uncontrolled diabetes, hyperlipidemia, and smoking.  6. He has not had a diabetic eye exam and I have requested that he do this on several occasions.  7. Follow up: 4  months

## 2017-12-24 ENCOUNTER — Ambulatory Visit (INDEPENDENT_AMBULATORY_CARE_PROVIDER_SITE_OTHER): Payer: Managed Care, Other (non HMO) | Admitting: Family Medicine

## 2017-12-24 ENCOUNTER — Encounter: Payer: Self-pay | Admitting: Family Medicine

## 2017-12-24 VITALS — BP 140/80 | HR 103 | Wt 323.0 lb

## 2017-12-24 DIAGNOSIS — Z9989 Dependence on other enabling machines and devices: Secondary | ICD-10-CM | POA: Diagnosis not present

## 2017-12-24 DIAGNOSIS — Z9119 Patient's noncompliance with other medical treatment and regimen: Secondary | ICD-10-CM | POA: Diagnosis not present

## 2017-12-24 DIAGNOSIS — G4733 Obstructive sleep apnea (adult) (pediatric): Secondary | ICD-10-CM | POA: Diagnosis not present

## 2017-12-24 DIAGNOSIS — Z91199 Patient's noncompliance with other medical treatment and regimen due to unspecified reason: Secondary | ICD-10-CM

## 2017-12-24 DIAGNOSIS — E119 Type 2 diabetes mellitus without complications: Secondary | ICD-10-CM | POA: Diagnosis not present

## 2017-12-24 DIAGNOSIS — F17200 Nicotine dependence, unspecified, uncomplicated: Secondary | ICD-10-CM

## 2017-12-24 DIAGNOSIS — I2510 Atherosclerotic heart disease of native coronary artery without angina pectoris: Secondary | ICD-10-CM

## 2017-12-24 LAB — POCT GLYCOSYLATED HEMOGLOBIN (HGB A1C): Hemoglobin A1C: 8.6 % — AB (ref 4.0–5.6)

## 2017-12-24 MED ORDER — TRULICITY 0.75 MG/0.5ML ~~LOC~~ SOAJ: 0.7500 mg | SUBCUTANEOUS | 1 refills | Status: DC

## 2017-12-24 NOTE — Patient Instructions (Addendum)
Your hemoglobin A1c is 8.6% today and your diabetes is not well controlled.   Start back on your Trulicity and set a reminder to take your other medications.   Cut back on carbohydrates and sweets.   Follow up in 4 months for diabetes.    Steps to Quit Smoking Smoking tobacco can be harmful to your health and can affect almost every organ in your body. Smoking puts you, and those around you, at risk for developing many serious chronic diseases. Quitting smoking is difficult, but it is one of the best things that you can do for your health. It is never too late to quit. What are the benefits of quitting smoking? When you quit smoking, you lower your risk of developing serious diseases and conditions, such as:  Lung cancer or lung disease, such as COPD.  Heart disease.  Stroke.  Heart attack.  Infertility.  Osteoporosis and bone fractures.  Additionally, symptoms such as coughing, wheezing, and shortness of breath may get better when you quit. You may also find that you get sick less often because your body is stronger at fighting off colds and infections. If you are pregnant, quitting smoking can help to reduce your chances of having a baby of low birth weight. How do I get ready to quit? When you decide to quit smoking, create a plan to make sure that you are successful. Before you quit:  Pick a date to quit. Set a date within the next two weeks to give you time to prepare.  Write down the reasons why you are quitting. Keep this list in places where you will see it often, such as on your bathroom mirror or in your car or wallet.  Identify the people, places, things, and activities that make you want to smoke (triggers) and avoid them. Make sure to take these actions: ? Throw away all cigarettes at home, at work, and in your car. ? Throw away smoking accessories, such as Scientist, research (medical). ? Clean your car and make sure to empty the ashtray. ? Clean your home, including  curtains and carpets.  Tell your family, friends, and coworkers that you are quitting. Support from your loved ones can make quitting easier.  Talk with your health care provider about your options for quitting smoking.  Find out what treatment options are covered by your health insurance.  What strategies can I use to quit smoking? Talk with your healthcare provider about different strategies to quit smoking. Some strategies include:  Quitting smoking altogether instead of gradually lessening how much you smoke over a period of time. Research shows that quitting "cold [place]" is more successful than gradually quitting.  Attending in-person counseling to help you build problem-solving skills. You are more likely to have success in quitting if you attend several counseling sessions. Even short sessions of 10 minutes can be effective.  Finding resources and support systems that can help you to quit smoking and remain smoke-free after you quit. These resources are most helpful when you use them often. They can include: ? Online chats with a Social worker. ? Telephone quitlines. ? Careers information officer. ? Support groups or group counseling. ? Text messaging programs. ? Mobile phone applications.  Taking medicines to help you quit smoking. (If you are pregnant or breastfeeding, talk with your health care provider first.) Some medicines contain nicotine and some do not. Both types of medicines help with cravings, but the medicines that include nicotine help to relieve withdrawal symptoms. Your health  care provider may recommend: ? Nicotine patches, gum, or lozenges. ? Nicotine inhalers or sprays. ? Non-nicotine medicine that is taken by mouth.  Talk with your health care provider about combining strategies, such as taking medicines while you are also receiving in-person counseling. Using these two strategies together makes you more likely to succeed in quitting than if you used either  strategy on its own. If you are pregnant or breastfeeding, talk with your health care provider about finding counseling or other support strategies to quit smoking. Do not take medicine to help you quit smoking unless told to do so by your health care provider. What things can I do to make it easier to quit? Quitting smoking might feel overwhelming at first, but there is a lot that you can do to make it easier. Take these important actions:  Reach out to your family and friends and ask that they support and encourage you during this time. Call telephone quitlines, reach out to support groups, or work with a counselor for support.  Ask people who smoke to avoid smoking around you.  Avoid places that trigger you to smoke, such as bars, parties, or smoke-break areas at work.  Spend time around people who do not smoke.  Lessen stress in your life, because stress can be a smoking trigger for some people. To lessen stress, try: ? Exercising regularly. ? Deep-breathing exercises. ? Yoga. ? Meditating. ? Performing a body scan. This involves closing your eyes, scanning your body from head to toe, and noticing which parts of your body are particularly tense. Purposefully relax the muscles in those areas.  Download or purchase mobile phone or tablet apps (applications) that can help you stick to your quit plan by providing reminders, tips, and encouragement. There are many free apps, such as QuitGuide from the [employer] Office manager for Disease Control and Prevention). You can find other support for quitting smoking (smoking cessation) through smokefree.gov and other websites.  How will I feel when I quit smoking? Within the first 24 hours of quitting smoking, you may start to feel some withdrawal symptoms. These symptoms are usually most noticeable 2-3 days after quitting, but they usually do not last beyond 2-3 weeks. Changes or symptoms that you might experience include:  Mood swings.  Restlessness,  anxiety, or irritation.  Difficulty concentrating.  Dizziness.  Strong cravings for sugary foods in addition to nicotine.  Mild weight gain.  Constipation.  Nausea.  Coughing or a sore throat.  Changes in how your medicines work in your body.  A depressed mood.  Difficulty sleeping (insomnia).  After the first 2-3 weeks of quitting, you may start to notice more positive results, such as:  Improved sense of smell and taste.  Decreased coughing and sore throat.  Slower heart rate.  Lower blood pressure.  Clearer skin.  The ability to breathe more easily.  Fewer sick days.  Quitting smoking is very challenging for most people. Do not get discouraged if you are not successful the first time. Some people need to make many attempts to quit before they achieve long-term success. Do your best to stick to your quit plan, and talk with your health care provider if you have any questions or concerns. This information is not intended to replace advice given to you by your health care provider. Make sure you discuss any questions you have with your health care provider. Document Released: 06/05/2001 Document Revised: 02/07/2016 Document Reviewed: 10/26/2014 Elsevier Interactive Patient Education  [employer].

## 2017-12-25 ENCOUNTER — Other Ambulatory Visit: Payer: Self-pay | Admitting: Family Medicine

## 2017-12-25 DIAGNOSIS — IMO0001 Reserved for inherently not codable concepts without codable children: Secondary | ICD-10-CM

## 2017-12-25 DIAGNOSIS — E1165 Type 2 diabetes mellitus with hyperglycemia: Principal | ICD-10-CM

## 2018-01-13 ENCOUNTER — Encounter: Payer: Self-pay | Admitting: Family Medicine

## 2018-01-14 ENCOUNTER — Other Ambulatory Visit: Payer: Self-pay

## 2018-01-14 DIAGNOSIS — E119 Type 2 diabetes mellitus without complications: Secondary | ICD-10-CM

## 2018-01-14 MED ORDER — METFORMIN HCL ER 500 MG PO TB24: ORAL_TABLET | ORAL | 2 refills | Status: DC

## 2018-03-18 ENCOUNTER — Other Ambulatory Visit: Payer: Self-pay | Admitting: Family Medicine

## 2018-03-18 ENCOUNTER — Telehealth: Payer: Self-pay | Admitting: Internal Medicine

## 2018-03-18 MED ORDER — SCOPOLAMINE 1 MG/3DAYS TD PT72: 1.0000 | MEDICATED_PATCH | TRANSDERMAL | 0 refills | Status: DC

## 2018-03-18 NOTE — Telephone Encounter (Signed)
I sent this to his pharmacy.

## 2018-03-18 NOTE — Progress Notes (Signed)
   Subjective:    Patient ID: Tim Allen, male    DOB: 17-Jun-1976, 42 y.o.   MRN: [id number]  HPI    Review of Systems     Objective:   Physical Exam        Assessment & Plan:

## 2018-03-18 NOTE — Telephone Encounter (Signed)
Per Estill Bamberg (pt's wife)   He's going fishing with a buddy of his this week out in the ocean and was hoping you could prescribe him the patch for sea sickness/ motion sickness. Would you be able to submit that today?

## 2018-03-25 ENCOUNTER — Other Ambulatory Visit: Payer: Self-pay | Admitting: Family Medicine

## 2018-03-25 DIAGNOSIS — E119 Type 2 diabetes mellitus without complications: Secondary | ICD-10-CM

## 2018-03-26 ENCOUNTER — Other Ambulatory Visit: Payer: Self-pay | Admitting: Family Medicine

## 2018-05-20 ENCOUNTER — Other Ambulatory Visit: Payer: Self-pay | Admitting: Family Medicine

## 2018-05-20 DIAGNOSIS — E119 Type 2 diabetes mellitus without complications: Secondary | ICD-10-CM

## 2018-05-21 ENCOUNTER — Encounter: Payer: Self-pay | Admitting: Family Medicine

## 2018-06-04 ENCOUNTER — Ambulatory Visit: Payer: Managed Care, Other (non HMO) | Admitting: Family Medicine

## 2018-06-04 ENCOUNTER — Ambulatory Visit (INDEPENDENT_AMBULATORY_CARE_PROVIDER_SITE_OTHER): Payer: Managed Care, Other (non HMO) | Admitting: Family Medicine

## 2018-06-04 ENCOUNTER — Encounter: Payer: Self-pay | Admitting: Family Medicine

## 2018-06-04 VITALS — BP 124/80 | HR 87 | Wt 322.0 lb

## 2018-06-04 DIAGNOSIS — G4733 Obstructive sleep apnea (adult) (pediatric): Secondary | ICD-10-CM | POA: Diagnosis not present

## 2018-06-04 DIAGNOSIS — Z9989 Dependence on other enabling machines and devices: Secondary | ICD-10-CM

## 2018-06-04 DIAGNOSIS — E1165 Type 2 diabetes mellitus with hyperglycemia: Secondary | ICD-10-CM

## 2018-06-04 DIAGNOSIS — R6882 Decreased libido: Secondary | ICD-10-CM | POA: Insufficient documentation

## 2018-06-04 DIAGNOSIS — Z79899 Other long term (current) drug therapy: Secondary | ICD-10-CM

## 2018-06-04 DIAGNOSIS — R4584 Anhedonia: Secondary | ICD-10-CM

## 2018-06-04 DIAGNOSIS — F17200 Nicotine dependence, unspecified, uncomplicated: Secondary | ICD-10-CM

## 2018-06-04 DIAGNOSIS — R5383 Other fatigue: Secondary | ICD-10-CM | POA: Insufficient documentation

## 2018-06-04 DIAGNOSIS — E291 Testicular hypofunction: Secondary | ICD-10-CM | POA: Insufficient documentation

## 2018-06-04 HISTORY — DX: Other fatigue: R53.83

## 2018-06-04 LAB — POCT GLYCOSYLATED HEMOGLOBIN (HGB A1C): HEMOGLOBIN A1C: 8.7 % — AB (ref 4.0–5.6)

## 2018-06-04 NOTE — Patient Instructions (Signed)
Hemoglobin A1c is 8.7% and your diabetes is not well controlled. I am referring you to the endocrinologist for further evaluation and treatment. They will call you to schedule the appointment.  Continue on your current medications for now. Try to walk more and increase physical activity Choose healthy snacks

## 2018-06-04 NOTE — Progress Notes (Signed)
Subjective:    Patient ID: Tim Allen, male    DOB: 05-16-1976, 41 y.o.   MRN: [id number]  Tim Allen is a 42 y.o. male who presents for follow-up of Type 2 diabetes mellitus and other chronic health conditions.  His concern today is lack of energy. States he is no longer wanting to do his usual activities that he likes doing. No longer playing [facility] which apparently he was doing daily. Is not exercising. States he has no sex drive. Does not want to do "anything". States he does not know if he is feeling depressed.   reports history of depression and/or anxiety. States he took medication in the past and did not like it. Took Zoloft and Wellbutrin. Refuses medication or counseling now.  No SI or HI.   He is less active. 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  States he is smoking 2 ppd again.   Using his CPAP nightly. 100% compliance.  Has DOT physical upcoming.   Patient is not checking home blood sugars.   Home blood sugar records: patient does not check sugars How often is blood sugars being checked: not checking sugars Current symptoms include: none. Patient denies increased appetite, nausea, visual disturbances, vomiting and weight loss.  Patient is checking their feet daily. Any Foot concerns (callous, ulcer, wound, thickened nails, toenail fungus, skin fungus, hammer toe): none Last dilated eye exam: havent had one  Current treatments: reports good compliance with Metformin, Farxiga and Trulicity. Medication compliance: good  Current diet: in general, a "healthy" diet   Current exercise: none Known diabetic complications: none and unknown   Depression screen [facility] 2/9 06/04/2018 09/09/2017 03/06/2017 10/01/2016 08/27/2016  Decreased Interest 3 0 0 0 0  Down, Depressed, Hopeless 0 0 0 0 1  PHQ - 2 Score 3 0 0 0 1  Altered sleeping 0 - - - -  Tired, decreased energy 3 - - - -  Change in appetite 3 - - - -  Feeling bad or failure about yourself  0 - - - -  Trouble  concentrating 0 - - - -  Moving slowly or fidgety/restless 0 - - - -  Suicidal thoughts 0 - - - -  PHQ-9 Score 9 - - - -  Difficult doing work/chores Not difficult at all - - - -     The following portions of the patient's history were reviewed and updated as appropriate: allergies, current medications, past medical history, past social history and problem list.  ROS as in subjective above.     Objective:    Physical Exam Alert and in no distress otherwise not examined.  Blood pressure 124/80, pulse 87, weight (!) 322 lb (146.1 kg).  Lab Review Diabetic Labs Latest Ref Rng & Units 06/04/2018 12/24/2017 09/09/2017 06/05/2017 03/06/2017  HbA1c 4.0 - 5.6 % 8.7(A) 8.6(A) 7.1% 7.2 8.2%  Microalbumin Not estab mg/dL - - - - -  Micro/Creat Ratio <30 mcg/mg creat - - - - -  Chol 100 - 199 mg/dL - - 134 - 141  HDL >39 mg/dL - - 35(L) - 30(L)  Calc LDL 0 - 99 mg/dL - - 73 - 83  Triglycerides 0 - 149 mg/dL - - 131 - 187(H)  Creatinine 0.76 - 1.27 mg/dL - - 0.84 - 0.87   BP/Weight 06/04/2018 12/24/2017 09/09/2017 06/05/2017 4/65/6812  Systolic BP 751 700 174 944 967  Diastolic BP 80 80 78 82 80  Wt. (Lbs) 322 323 325 323  318.8  BMI 41.62 41.75 42.01 42.91 42.35   Foot/eye exam completion dates 06/05/2017  Foot Form Completion Done    Tim Allen  reports that he has been smoking. He has a 20.00 pack-year smoking history. He has never used smokeless tobacco. He reports that he does not drink alcohol or use drugs.     Assessment & Plan:    Uncontrolled type 2 diabetes mellitus with hyperglycemia ([place]) - Plan: HgB A1c, Ambulatory referral to Endocrinology, CBC with Differential/Platelet, Comprehensive metabolic panel, T4, free, TSH, Lipid panel  OSA on CPAP  Smoker  Morbid obesity (HCC)  Fatigue, unspecified type - Plan: CBC with Differential/Platelet, Comprehensive metabolic panel, T4, free, TSH, Vitamin B12, VITAMIN D 25 Hydroxy (Vit-D Deficiency, Fractures), Testosterone  Decreased  libido - Plan: Testosterone  Medication management - Plan: Lipid panel  Anhedonia - Plan: Testosterone  1. Rx changes: none Hgb A1c today 8.7% and diabetes continues to be uncontrolled. He is not in favor of making diet or lifestyle changes and would like medication adjustment. 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 Has not been able to tolerate higher doses of Metformin in the past due to GI upset. Agrees to referral to endocrinologist.  2. Education: Reviewed 'ABCs' of diabetes management (respective goals in parentheses):  A1C (<7), blood pressure (<130/80), and cholesterol (LDL <100). 3. Compliance at present is estimated to be fair. Efforts to improve compliance (if necessary) will be directed at dietary modifications: choose healthy beverages and snacks. , increased exercise and regular blood sugar monitoring: daily. 4. Concerns today regarding his energy level and libido. No obvious physiological symptoms to explain this. Will check labs including thyroid, B12, vitamin D, testosterone and follow up. Suspect depression may be playing a role and he refuses medication or counseling for this.  5. Smoking cessation counseling done. He has increased to 2 ppd. Aware of long term consequences.  6. OSA- using CPAP nightly and benefiting from this.  7. He will get a DOT physical before the end of the month.  8. As far as I can tell, he is compliant with medications including lisinopril and statin per standard of care for patients with diabetes.  9. Non compliant with getting diabetic eye exam. I have asked him on several occasions to get this taken care of and he has not.  10. Follow up: 3 months

## 2018-06-05 ENCOUNTER — Other Ambulatory Visit: Payer: Self-pay | Admitting: Internal Medicine

## 2018-06-05 LAB — COMPREHENSIVE METABOLIC PANEL
ALT: 34 IU/L (ref 0–44)
AST: 17 IU/L (ref 0–40)
Albumin/Globulin Ratio: 1.6 (ref 1.2–2.2)
Albumin: 4.4 g/dL (ref 3.5–5.5)
Alkaline Phosphatase: 74 IU/L (ref 39–117)
BUN/Creatinine Ratio: 9 (ref 9–20)
BUN: 7 mg/dL (ref 6–24)
Bilirubin Total: 0.3 mg/dL (ref 0.0–1.2)
CALCIUM: 9.4 mg/dL (ref 8.7–10.2)
CHLORIDE: 101 mmol/L (ref 96–106)
CO2: 25 mmol/L (ref 20–29)
CREATININE: 0.77 mg/dL (ref 0.76–1.27)
GFR calc Af Amer: 129 mL/min/{1.73_m2} (ref >59)
GFR, EST NON AFRICAN AMERICAN: 112 mL/min/{1.73_m2} (ref >59)
GLOBULIN, TOTAL: 2.7 g/dL (ref 1.5–4.5)
Glucose: 209 mg/dL — ABNORMAL HIGH (ref 65–99)
POTASSIUM: 4.5 mmol/L (ref 3.5–5.2)
SODIUM: 140 mmol/L (ref 134–144)
Total Protein: 7.1 g/dL (ref 6.0–8.5)

## 2018-06-05 LAB — CBC WITH DIFFERENTIAL/PLATELET
Basophils Absolute: 0.1 10*3/uL (ref 0.0–0.2)
Basos: 1 %
EOS (ABSOLUTE): 0.1 10*3/uL (ref 0.0–0.4)
EOS: 1 %
HEMOGLOBIN: 15.9 g/dL (ref 13.0–17.7)
Hematocrit: 47.8 % (ref 37.5–51.0)
Immature Grans (Abs): 0 10*3/uL (ref 0.0–0.1)
Immature Granulocytes: 0 %
LYMPHS: 41 %
Lymphocytes Absolute: 3.7 10*3/uL — ABNORMAL HIGH (ref 0.7–3.1)
MCH: 29.3 pg (ref 26.6–33.0)
MCHC: 33.3 g/dL (ref 31.5–35.7)
MCV: 88 fL (ref 79–97)
MONOCYTES: 10 %
Monocytes Absolute: 0.9 10*3/uL (ref 0.1–0.9)
Neutrophils Absolute: 4.3 10*3/uL (ref 1.4–7.0)
Neutrophils: 47 %
PLATELETS: 251 10*3/uL (ref 150–450)
RBC: 5.43 x10E6/uL (ref 4.14–5.80)
RDW: 13.6 % (ref 12.3–15.4)
WBC: 9.1 10*3/uL (ref 3.4–10.8)

## 2018-06-05 LAB — LIPID PANEL
Chol/HDL Ratio: 3.4 ratio (ref 0.0–5.0)
Cholesterol, Total: 101 mg/dL (ref 100–199)
HDL: 30 mg/dL — ABNORMAL LOW (ref >39)
LDL Calculated: 32 mg/dL (ref 0–99)
Triglycerides: 196 mg/dL — ABNORMAL HIGH (ref 0–149)
VLDL Cholesterol Cal: 39 mg/dL (ref 5–40)

## 2018-06-05 LAB — TESTOSTERONE: Testosterone: 142 ng/dL — ABNORMAL LOW (ref 264–916)

## 2018-06-05 LAB — TSH: TSH: 1.65 u[IU]/mL (ref 0.450–4.500)

## 2018-06-05 LAB — VITAMIN B12: Vitamin B-12: 731 pg/mL (ref 232–1245)

## 2018-06-05 LAB — T4, FREE: Free T4: 1.01 ng/dL (ref 0.82–1.77)

## 2018-06-05 LAB — VITAMIN D 25 HYDROXY (VIT D DEFICIENCY, FRACTURES): VIT D 25 HYDROXY: 14.6 ng/mL — AB (ref 30.0–100.0)

## 2018-06-05 MED ORDER — VITAMIN D (ERGOCALCIFEROL) 1.25 MG (50000 UNIT) PO CAPS: 50000.0000 [IU] | ORAL_CAPSULE | ORAL | 0 refills | Status: DC

## 2018-06-06 LAB — SPECIMEN STATUS REPORT

## 2018-06-06 LAB — TESTOSTERONE, FREE: Testosterone, Free: 9.1 pg/mL (ref 6.8–21.5)

## 2018-06-16 ENCOUNTER — Ambulatory Visit (INDEPENDENT_AMBULATORY_CARE_PROVIDER_SITE_OTHER): Payer: Managed Care, Other (non HMO) | Admitting: Internal Medicine

## 2018-06-16 ENCOUNTER — Ambulatory Visit
Admission: RE | Admit: 2018-06-16 | Discharge: 2018-06-16 | Disposition: A | Discharge status: Home / Self Care | Payer: Managed Care, Other (non HMO) | Source: Ambulatory Visit | Attending: Family Medicine | Admitting: Family Medicine

## 2018-06-16 ENCOUNTER — Encounter: Payer: Self-pay | Admitting: Internal Medicine

## 2018-06-16 ENCOUNTER — Encounter: Payer: Self-pay | Admitting: Family Medicine

## 2018-06-16 ENCOUNTER — Ambulatory Visit (INDEPENDENT_AMBULATORY_CARE_PROVIDER_SITE_OTHER): Payer: Managed Care, Other (non HMO) | Admitting: Family Medicine

## 2018-06-16 VITALS — BP 138/64 | HR 101 | Ht 73.0 in | Wt 326.2 lb

## 2018-06-16 VITALS — BP 110/72 | HR 102

## 2018-06-16 DIAGNOSIS — R5383 Other fatigue: Secondary | ICD-10-CM

## 2018-06-16 DIAGNOSIS — E1165 Type 2 diabetes mellitus with hyperglycemia: Secondary | ICD-10-CM

## 2018-06-16 DIAGNOSIS — G4733 Obstructive sleep apnea (adult) (pediatric): Secondary | ICD-10-CM

## 2018-06-16 DIAGNOSIS — F17200 Nicotine dependence, unspecified, uncomplicated: Secondary | ICD-10-CM | POA: Diagnosis not present

## 2018-06-16 DIAGNOSIS — R6882 Decreased libido: Secondary | ICD-10-CM

## 2018-06-16 DIAGNOSIS — R7989 Other specified abnormal findings of blood chemistry: Secondary | ICD-10-CM | POA: Diagnosis not present

## 2018-06-16 DIAGNOSIS — Z9189 Other specified personal risk factors, not elsewhere classified: Secondary | ICD-10-CM

## 2018-06-16 DIAGNOSIS — E559 Vitamin D deficiency, unspecified: Secondary | ICD-10-CM

## 2018-06-16 DIAGNOSIS — Z125 Encounter for screening for malignant neoplasm of prostate: Secondary | ICD-10-CM | POA: Diagnosis not present

## 2018-06-16 DIAGNOSIS — E1142 Type 2 diabetes mellitus with diabetic polyneuropathy: Secondary | ICD-10-CM

## 2018-06-16 DIAGNOSIS — I2510 Atherosclerotic heart disease of native coronary artery without angina pectoris: Secondary | ICD-10-CM

## 2018-06-16 DIAGNOSIS — Z9989 Dependence on other enabling machines and devices: Secondary | ICD-10-CM

## 2018-06-16 HISTORY — DX: Vitamin D deficiency, unspecified: E55.9

## 2018-06-16 MED ORDER — METFORMIN HCL ER 500 MG PO TB24: 2000.0000 mg | ORAL_TABLET | Freq: Every day | ORAL | 3 refills | Status: DC

## 2018-06-16 NOTE — Progress Notes (Signed)
   Subjective:    Patient ID: Tim Allen, male    DOB: 03/21/1976, 42 y.o.   MRN: [id number]  HPI Chief Complaint  Patient presents with  . blood work    blood work   He is here to follow-up on abnormal blood work and fatigue.  Testosterone level was found to very low at 142 with a normal free %.  PMH is significant for uncontrolled diabetes, elevated 10 yr ASCVD risk, OSA treated with CPAP, headaches "for years".  Reports having night sweats for the past few months occuring 1-2 times per month. No daytime hot flashes.  Has not noticed changes in muscle mass or hair loss.   Smoking has increased back to 2 ppd. He is a Administrator.   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   Recent labs showed low vitamin D and he is now taking a prescription strength vitamin D supplement once weekly.  Denies fever, chills, dizziness, chest pain, palpitations, shortness of breath, abdominal pain, N/V/D, urinary symptoms, LE edema.   Reviewed allergies, medications, past medical, surgical, family, and social history.   Review of Systems Pertinent positives and negatives in the history of present illness.     Objective:   Physical Exam BP 110/72   Pulse (!) 102   Alert and oriented and in no acute distress. Heavy smell of cigarette smoke. Refuses GU or prostate exam. Skin is warm and dry. Speech, mood and thought process normal.       Assessment & Plan:  Low serum testosterone - Plan: Testosterone,Free and Total, FSH/LH, Prolactin, PSA  Fatigue, unspecified type - Plan: DG Chest 2 View, Ambulatory referral to Cardiology  Heavy smoker - Plan: DG Chest 2 View, Ambulatory referral to Cardiology  Screening for prostate cancer - Plan: PSA  ASCVD (arteriosclerotic cardiovascular disease) - Plan: Ambulatory referral to Cardiology  OSA on CPAP  Uncontrolled type 2 diabetes mellitus with hyperglycemia ([place])  Morbid obesity ([place]) - Plan: Ambulatory referral to  Cardiology  Decreased libido  At risk for heart disease - Plan: Ambulatory referral to Cardiology  Vitamin D deficiency  Discussed abnormal lab results including low testosterone.  We will repeat testosterone level today.  This may be the source for his fatigue.  We will also check other labs including PSA, FSH/LH and prolactin. He will see his endocrinologist today for uncontrolled diabetes. Chest x-ray ordered due to fatigue and heavy smoking history.  He is now smoking 2 packs/day. Reports good compliance with CPAP. Discussed referral to cardiology due to multiple risk factors for heart disease. Vitamin D def and is currently on a supplement.  Follow-up pending labs

## 2018-06-16 NOTE — Progress Notes (Signed)
Name: Tim Allen  MRN/ DOB: [id number], 1975-08-01   Age/ Sex: 42 y.o., male    PCP: Girtha Rm, NP-C   Reason for Endocrinology Evaluation: Type 2 Diabetes Mellitus     Date of Initial Endocrinology Visit: 06/16/2018     PATIENT IDENTIFIER: Mr. Tim Allen is a 42 y.o. male with a past medical history of HTN, Dyslipidemia and T2DM . And OSA. The patient presented for initial endocrinology clinic visit on 06/16/2018 for consultative assistance with his diabetes management.    HPI: Mr. Delfavero was    Diagnosed with T2DM in March, 2019 Prior Medications tried/Intolerance: no Currently checking blood sugars 0 x / day.  Hypoglycemia episodes : no            Hemoglobin A1c has ranged from 7.1% in 08/2017, peaking at 13.9% in 08/2016 Patient required assistance for hypoglycemia: no Patient has required hospitalization within the last 1 year from hyper or hypoglycemia: no  In terms of diet, the patient drinks sweet tea, and soda, and other sugar sweetened beverages.   Started on Metformin, followed  By Wilder Glade and trulicity   He is a Administrator   HOME DIABETES REGIMEN: Metformin XR 532m Two tablets daily   Farxiga 5 mg daily  trulicity 00.25mg weekly   Statin: yes ACE-I/ARB: Yes Prior Diabetic Education: no   METER DOWNLOAD SUMMARY: Did not bring    DIABETIC COMPLICATIONS: Microvascular complications:   Denies: neuropathy , retinopathy , nephropathy  Last eye exam: Completed never  Macrovascular complications:    Denies: CAD, PVD, CVA   PAST HISTORY: Past Medical History:  Past Medical History:  Diagnosis Date  . Heavy cigarette smoker   . [place]   . Sleep apnea   . [place] 2 diabetes [place]    new diagnosis with HgB A1c 13.9% on 08/27/16    Past Surgical History:  Past Surgical History:  Procedure Laterality Date  . KNEE SURGERY Right age 89  reconstruction after traumatic injury      Social History:  reports  that he has been smoking. He has a 20.00 pack-year smoking history. He has never used smokeless tobacco. He reports that he does not drink alcohol or use drugs. Family History:  Family History  Problem Relation Age of Onset  . Diabetes Father       HOME MEDICATIONS: Allergies as of 06/16/2018   No Known Allergies     Medication List       Accurate as of June 16, 2018  9:44 AM. Always use your most recent med list.        atorvastatin 20 MG tablet Commonly known as:  LIPITOR TAKE 1 TABLET BY MOUTH ONCE DAILY   BLOOD GLUCOSE TEST STRIPS Strp Test twice a day. Pt uses one touch verio flex meter   FARXIGA 5 MG Tabs tablet Generic drug:  dapagliflozin propanediol TAKE 1 TABLET BY MOUTH ONCE DAILY   ibuprofen 200 MG tablet Commonly known as:  ADVIL,MOTRIN Take 200 mg by mouth every 6 (six) hours as needed for moderate pain.   lisinopril 5 MG tablet Commonly known as:  PRINIVIL,ZESTRIL TAKE 1 TABLET BY MOUTH ONCE DAILY   metFORMIN 500 MG 24 hr tablet Commonly known as:  GLUCOPHAGE-XR TAKE 2 TABLETS BY MOUTH ONCE DAILY WITH BREAKFAST   ONETOUCH DELICA LANCETS FINE Misc Test twice a day   TRULICITY 04.27MCW/2.3JSSopn Generic drug:  Dulaglutide Inject 0.75 mg as directed once  a week. Sunday   Vitamin D (Ergocalciferol) 1.25 MG (50000 UT) Caps capsule Commonly known as:  DRISDOL Take 1 capsule (50,000 Units total) by mouth every 7 (seven) days.        ALLERGIES: No Known Allergies   REVIEW OF SYSTEMS: A comprehensive ROS was conducted with the patient and is negative except as per HPI and below:  Review of Systems  Constitutional: Positive for malaise/fatigue. Negative for weight loss.  HENT: Negative for congestion and sore throat.   Eyes: Negative for blurred vision and pain.  Respiratory: Negative for cough and shortness of breath.   Cardiovascular: Negative for chest pain and palpitations.  Gastrointestinal: Negative for diarrhea and nausea.    Genitourinary: Positive for frequency.  Skin: Negative.   Endo/Heme/Allergies: Positive for polydipsia.  Psychiatric/Behavioral: Negative for depression. The patient is not nervous/anxious.       OBJECTIVE:   VITAL SIGNS: BP 138/64 (BP Location: Left Arm, Patient Position: Sitting, Cuff Size: Large)   Pulse (!) 101   Ht 6' 1"  (1.854 m)   Wt (!) 326 lb 3.2 oz (148 kg)   SpO2 (!) 86%   BMI 43.04 kg/m    PHYSICAL EXAM:  General: Pt appears well and is in NAD  Hydration: Well-hydrated with moist mucous membranes and good skin turgor  HEENT: Head: Unremarkable with good dentition. Oropharynx clear without exudate.  Eyes: External eye exam normal without stare, lid lag or exophthalmos.  EOM intact.  PERRL.  Neck: General: Supple without adenopathy or carotid bruits. Thyroid: Thyroid size normal.  No goiter or nodules appreciated. No thyroid bruit.  Lungs: Clear with good BS bilat with no rales, rhonchi, or wheezes  Heart: RRR with normal S1 and S2 and no gallops; no murmurs; no rub  Abdomen: Normoactive bowel sounds, soft, nontender, without masses or organomegaly palpable  Extremities:  Lower extremities - No pretibial edema. No lesions.  Skin: Normal texture and temperature to palpation. No rash noted. No Acanthosis nigricans/skin tags. No lipohypertrophy.  Neuro: MS is good with appropriate affect, pt is alert and Ox3     DM foot exam: The skin of the feet is intact without sores or ulcerations.Toe nails thickened and discolored The pedal pulses are 2+ on right and 2+ on left. The sensation is absent on the right great toe, rest is normal  to a screening 5.07, 10 gram monofilament   DATA REVIEWED:  Lab Results  Component Value Date   HGBA1C 8.7 (A) 06/04/2018   HGBA1C 8.6 (A) 12/24/2017   HGBA1C 7.1% 09/09/2017   Lab Results  Component Value Date   MICROALBUR 0.6 08/27/2016   LDLCALC 32 06/04/2018   CREATININE 0.77 06/04/2018   Lab Results  Component Value Date    MICRALBCREAT 3 08/27/2016    Lab Results  Component Value Date   CHOL 101 06/04/2018   HDL 30 (L) 06/04/2018   LDLCALC 32 06/04/2018   TRIG 196 (H) 06/04/2018   CHOLHDL 3.4 06/04/2018        ASSESSMENT / PLAN / RECOMMENDATIONS:   1) Type 2 Diabetes Mellitus, poorly controlled, With neuropathic complications - Most recent A1c of 8.7 %. Goal A1c < 7.0 %.    Plan: GENERAL: I have discussed with the patient the pathophysiology of diabetes. We went over the natural progression of the disease. We talked about both insulin resistance and insulin deficiency. We stressed the importance of lifestyle changes including diet and exercise. I explained the complications associated with diabetes including retinopathy, nephropathy,  neuropathy as well as increased risk of cardiovascular disease. We went over the benefit seen with glycemic control.    I explained to the patient that diabetic patients are at higher than normal risk for amputations. The patient was informed that diabetes is the number one cause of non-traumatic amputations in [place]. The patient was advised to look and examine their feet , wear proper fitting shoes and not to go barefoot.   The patient was informed on the importance of seeing an ophthalmologist at least yearly. We explained that diabetes is the number one cause of blindness in [place]. We also explained that tight glycemic control can help reduce the risk of diabetic eye complications.   Pt advised about low CHO meals and to avoid snacking when possible.   Will refer him to our CDE  Discussed pharmacokinetics of basal/bolus insulin and the importance of taking prandial insulin with meals.   We also discussed avoiding sugar-sweetened beverages and snacks, when possible.   I will max out on his Metformin , and if he has no side effects, will max out ono his Trulicity or [place],.    MEDICATIONS:  Increase Metformin XR 500 mg to 4 tabs with breakfast - titration  instructions provided  Continue Trulicity 3.14 mg weekly  Continue Farxiga 5 mg daily   EDUCATION / INSTRUCTIONS:  BG monitoring instructions: Patient is instructed to check his blood sugars 2 times a day, fasting and bedtime.  Call [facility] if: BG persistently < 70 or > 300. . I reviewed the Rule of 15 for the treatment of hypoglycemia in detail with the patient. Literature supplied.   2) Diabetic complications:   Eye: Unknown to have known diabetic retinopathy.   Neuro/ Feet: Does have known diabetic peripheral neuropathy. Based on loss of sensation at the right and 3rd toes   Renal: Patient does nothave known baseline CKD. He is  on an ACEI/ARB at present.   3) Lipids: Patient is  on a statin.    4) Hypertension: he is  at goal of < 140/90 mmHg.   5) Tobacco abuse:  - Over 3  Minutes were spent in counseling about tobacco cessation   F/u in 4 weeks   Signed electronically by: Mack Guise, MD  [facility]  [facility] [address] [place], [place] [address] Phone: [phone] FAX: [phone]   CC: Girtha Rm, NP-C [place] [address] Phone: [phone]  Fax: [phone]    Return to Endocrinology clinic as below: Future Appointments  Date Time Provider [place]  06/16/2018  9:50 AM Ananda Sitzer, Melanie Crazier, MD [facility] None  10/08/2018  8:15 AM Girtha Rm, NP-C [facility]

## 2018-06-16 NOTE — Patient Instructions (Addendum)
-   Check sugars twice a day (fasting and bedtime) - Exercise (brisk walking ) 150 minutes per week  - Choose healthy, lower carb lower calorie snacks: toss salad, cooked vegetables, cottage cheese, peanut butter, low fat cheese / string cheese, lower sodium deli meat, tuna salad or chicken salad   - Increase Metformin to 3 tablets with breakfast, If you do not have any nausea or diarrhea, please increase to 4 tablets with breakfast - Continue Farxiga 5 mg daily - Continue Trulicity 1.73 mg weekly

## 2018-06-17 LAB — PROLACTIN: PROLACTIN: 15.1 ng/mL (ref 4.0–15.2)

## 2018-06-17 LAB — FSH/LH
FSH: 4.9 m[IU]/mL (ref 1.5–12.4)
LH: 4.7 m[IU]/mL (ref 1.7–8.6)

## 2018-06-17 LAB — TESTOSTERONE,FREE AND TOTAL
TESTOSTERONE FREE: 12 pg/mL (ref 6.8–21.5)
TESTOSTERONE: 158 ng/dL — AB (ref 264–916)

## 2018-06-17 LAB — PSA: Prostate Specific Ag, Serum: 0.7 ng/mL (ref 0.0–4.0)

## 2018-06-21 ENCOUNTER — Other Ambulatory Visit: Payer: Self-pay | Admitting: Family Medicine

## 2018-06-23 ENCOUNTER — Other Ambulatory Visit: Payer: Self-pay | Admitting: Family Medicine

## 2018-06-23 DIAGNOSIS — E1142 Type 2 diabetes mellitus with diabetic polyneuropathy: Secondary | ICD-10-CM

## 2018-06-24 ENCOUNTER — Other Ambulatory Visit: Payer: Self-pay | Admitting: Family Medicine

## 2018-06-24 DIAGNOSIS — E1142 Type 2 diabetes mellitus with diabetic polyneuropathy: Secondary | ICD-10-CM

## 2018-07-09 ENCOUNTER — Other Ambulatory Visit: Payer: Self-pay | Admitting: Family Medicine

## 2018-07-09 ENCOUNTER — Encounter: Payer: Self-pay | Admitting: Family Medicine

## 2018-07-09 DIAGNOSIS — E1142 Type 2 diabetes mellitus with diabetic polyneuropathy: Secondary | ICD-10-CM

## 2018-07-24 ENCOUNTER — Ambulatory Visit: Payer: Managed Care, Other (non HMO) | Admitting: Family Medicine

## 2018-07-25 ENCOUNTER — Encounter: Payer: Self-pay | Admitting: Internal Medicine

## 2018-07-25 ENCOUNTER — Encounter: Payer: Self-pay | Admitting: Family Medicine

## 2018-07-25 ENCOUNTER — Ambulatory Visit (INDEPENDENT_AMBULATORY_CARE_PROVIDER_SITE_OTHER): Payer: Managed Care, Other (non HMO) | Admitting: Family Medicine

## 2018-07-25 VITALS — BP 130/82 | HR 97 | Wt 321.0 lb

## 2018-07-25 DIAGNOSIS — E1165 Type 2 diabetes mellitus with hyperglycemia: Secondary | ICD-10-CM | POA: Diagnosis not present

## 2018-07-25 DIAGNOSIS — E291 Testicular hypofunction: Secondary | ICD-10-CM

## 2018-07-25 DIAGNOSIS — E559 Vitamin D deficiency, unspecified: Secondary | ICD-10-CM

## 2018-07-25 DIAGNOSIS — Z6841 Body Mass Index (BMI) 40.0 and over, adult: Secondary | ICD-10-CM | POA: Diagnosis not present

## 2018-07-25 DIAGNOSIS — F17200 Nicotine dependence, unspecified, uncomplicated: Secondary | ICD-10-CM

## 2018-07-25 MED ORDER — VITAMIN D (ERGOCALCIFEROL) 1.25 MG (50000 UNIT) PO CAPS: 50000.0000 [IU] | ORAL_CAPSULE | ORAL | 0 refills | Status: DC

## 2018-07-25 MED ORDER — TESTOSTERONE 20.25 MG/1.25GM (1.62%) TD GEL: TRANSDERMAL | 2 refills | Status: DC

## 2018-07-25 NOTE — Progress Notes (Signed)
Subjective:    Patient ID: Tim Allen, male    DOB: 01-07-1976, 43 y.o.   MRN: [id number]  Tim Allen is a 43 y.o. male who presents for follow-up of low testosterone and other chronic health conditions.   History of fatigue, decreased libido with significantly low testosterone level.  This was repeated and confirmed.  He had normal PSA, FSH/LH and prolactin.  He would like to start on testosterone replacement therapy.  Vitamin D deficiency. States he lost his vitamin D prescription and requests a refill.   He is now seeing an endocrinologist for his diabetes management.  He is requesting a new meter from [place].  He states his meter at home is not working.  Denies fever, chills, dizziness, chest pain, palpitations, shortness of breath, abdominal pain, N/V/D, urinary symptoms.    The following portions of the patient's history were reviewed and updated as appropriate: allergies, current medications, past medical history, past social history and problem list.  ROS as in subjective above.     Objective:    Physical Exam Alert and in no distress otherwise not examined.  Blood pressure 130/82, pulse 97, weight (!) 321 lb (145.6 kg).  Lab Review Diabetic Labs Latest Ref Rng & Units 06/04/2018 12/24/2017 09/09/2017 06/05/2017 03/06/2017  HbA1c 4.0 - 5.6 % 8.7(A) 8.6(A) 7.1% 7.2 8.2%  Microalbumin Not estab mg/dL - - - - -  Micro/Creat Ratio <30 mcg/mg creat - - - - -  Chol 100 - 199 mg/dL 101 - 134 - 141  HDL >39 mg/dL 30(L) - 35(L) - 30(L)  Calc LDL 0 - 99 mg/dL 32 - 73 - 83  Triglycerides 0 - 149 mg/dL 196(H) - 131 - 187(H)  Creatinine 0.76 - 1.27 mg/dL 0.77 - 0.84 - 0.87   BP/Weight 07/25/2018 06/16/2018 06/16/2018 67/67/2094 7/0/9628  Systolic BP 366 294 765 465 035  Diastolic BP 82 64 72 80 80  Wt. (Lbs) 321 326.2 - 322 323  BMI 42.35 43.04 - 41.62 41.75   Foot/eye exam completion dates 06/05/2017  Foot Form Completion Done    Aldon  reports that he has been smoking. He  has a 20.00 pack-year smoking history. He has never used smokeless tobacco. He reports that he does not drink alcohol or use drugs.     Assessment & Plan:    Hypogonadism in male - Plan: Testosterone 20.25 MG/1.25GM (1.62%) GEL  Uncontrolled type 2 diabetes mellitus with hyperglycemia (HCC)  BMI 45.0-49.9, adult (HCC)  Morbid obesity (HCC)  Heavy smoker  Vitamin D deficiency - Plan: Vitamin D, Ergocalciferol, (DRISDOL) 1.25 MG (50000 UT) CAPS capsule  1. We will start him on testosterone replacement today.  Discussed topical administration and potential side effects or risks.  Reviewed recent lab results with patient.  Normal PSA as well as FSH/LH and prolactin. 2. He does not appear to be making healthy lifestyle changes.  He is continuing to smoke 2 packs/day.  He is not interested in stopping. 3. Vitamin D deficiency-states he lost the prescription bottle and requests a refill of vitamin D.  This was sent to his pharmacy. 4. He was provided with a One Touch Verio Flex meter today so that he can check his blood sugars.  He will need to follow-up with his endocrinologist for his diabetes. 5. Discussed referral to [facility] clinic to help him with weight loss and to help with chronic health conditions and he declines. 6. Follow up: 3 months for office visit and labs  due to testosterone replacement

## 2018-07-25 NOTE — Patient Instructions (Signed)
Follow up in 3 months.   Testosterone skin gel What is this medicine? TESTOSTERONE (tes TOS ter one) is the main male hormone. It supports normal male traits such as muscle growth, facial hair, and deep voice. This gel is used in males to treat low testosterone levels. This medicine may be used for other purposes; ask your health care provider or pharmacist if you have questions. COMMON BRAND NAME(S): AndroGel, FORTESTA, Testim, Vogelxo What should I tell my health care provider before I take this medicine? They need to know if you have any of these conditions: -breast cancer -diabetes -heart disease -if a male partner is pregnant or trying to get pregnant -kidney disease -liver disease -lung disease -prostate cancer, enlargement -an unusual or allergic reaction to testosterone, soy proteins, other medicines, foods, dyes, or preservatives -pregnant or trying to get pregnant -breast-feeding How should I use this medicine? This medicine is for external use only. This medicine is applied at the same time every day (preferably in the morning) to clean, dry, intact skin. If you take a bath or shower in the morning, apply the gel after the bath or shower. Follow the directions on the prescription label. Make sure that you are using your testosterone gel product correctly and applying it only to the appropriate skin area (see below). Allow the skin to dry a few minutes then cover with clothing to prevent others from coming in contact with the medicine on your skin. The gel is flammable. Avoid fire, flame, or smoking until the gel has dried. Wash your hands with soap and water after use. For AndroGel 1% Packets: Open the packet(s) needed for your dose. You can put the entire dose into your palm all at once or just a little at a time to apply. If you prefer, you can instead squeeze the gel directly onto the area you are applying it to. Apply on the shoulders, upper arm, or abdomen as directed. Do not  apply to the scrotum or genitals. Be sure you use the correct total dose. It is best to wait 5 to 6 hours after application of the gel before showering or swimming. For AndroGel 1%: Pump the dose into the palm of your hand. You can put the entire dose into your palm all at once or just a little at a time to apply. If you prefer, you can instead pump the gel directly onto the area you are applying it to. Apply on the shoulders, upper arm, or abdomen as directed. Do not apply to the scrotum or genitals. Be sure you use the correct total dose. It is best to wait for 5 to 6 hours after application of the gel before showering or swimming. For Androgel 1.62% packets: Open the packet(s) needed for your dose. You can put the entire dose into your palm all at once or just a little at a time to apply. If you prefer, you can instead squeeze the gel directly onto the area you are applying it to. Apply on the shoulders and upper arms as directed. Do not apply to other parts of the body including the abdomen, genitals, chest, armpits, or knees. Be sure you use the correct total dose. It is best to wait 2 hours after application of the gel before washing, showering, or swimming. For AndroGel 1.62%: Pump the dose into the palm of your hand. Dispense one pump of gel at a time into the palm of your hand before applying it. If you prefer, you can instead  pump the gel directly onto the area you are applying it to. Apply on the shoulders and upper arms as directed. Do not apply to other parts of the body including the abdomen, genitals, chest, armpits, or knees. Be sure you use the correct total dose. It is best to wait 2 hours after application of the gel before washing, showering, or swimming. For Testim: Open the tube(s) needed for your dose. Squeeze the gel from the tube into the palm of your hand. Apply on the shoulders or upper arms as directed. Do not apply to the scrotum, genitals, or abdomen. Be sure you use the correct  total dose. Do not shower or swim for at least 2 hours after application of the gel. For Fortesta: Use the multi-dose pump to pump the gel directly onto the area you are applying it to. Apply on the thighs as directed. Do not apply to the abdomen, penis, scrotum, shoulders or upper arms. Gently rub the gel onto the skin using your finger. Be sure you use the correct total dose. Do not shower or swim for at least 2 hours after application of the gel. A special MedGuide will be given to you by the pharmacist with each prescription and refill. Be sure to read this information carefully each time. Talk to your pediatrician regarding the use of this medicine in children. Special care may be needed. Overdosage: If you think you have taken too much of this medicine contact a poison control center or emergency room at once. NOTE: This medicine is only for you. Do not share this medicine with others. What if I miss a dose? If you miss a dose, use it as soon as you can. If it is almost time for your next dose, use only that dose. Do not use double or extra doses. What may interact with this medicine? -medicines for diabetes -medicines that treat or prevent blood clots like warfarin -oxyphenbutazone -propranolol -steroid medicines like prednisone or cortisone This list may not describe all possible interactions. Give your health care provider a list of all the medicines, herbs, non-prescription drugs, or dietary supplements you use. Also tell them if you smoke, drink alcohol, or use illegal drugs. Some items may interact with your medicine. What should I watch for while using this medicine? Visit your doctor or health care professional for regular checks on your progress. They will need to check the level of testosterone in your blood. This medicine is only approved for use in men who have low levels of testosterone related to certain medical conditions. Heart attacks and strokes have been reported with the  use of this medicine. Notify your doctor or health care professional and seek emergency treatment if you develop breathing problems; changes in vision; confusion; chest pain or chest tightness; sudden arm pain; severe, sudden headache; trouble speaking or understanding; sudden numbness or weakness of the face, arm or leg; loss of balance or coordination. Talk to your doctor about the risks and benefits of this medicine. This medicine can transfer from your body to others. If a person or pet comes in contact with the area where this medicine was applied to your skin, they may have a serious risk of side effects. If you cannot avoid skin-to-skin contact with another person, make sure the site where this medicine was applied is covered with clothing. If accidental contact happens, the skin of the person or pet should be washed right away with soap and water. Also, a male partner who is pregnant  or trying to get pregnant should avoid contact with the gel or treated skin. This medicine may affect blood sugar levels. If you have diabetes, check with your doctor or health care professional before you change your diet or the dose of your diabetic medicine. This drug is banned from use in athletes by most athletic organizations. What side effects may I notice from receiving this medicine? Side effects that you should report to your doctor or health care professional as soon as possible: -allergic reactions like skin rash, itching or hives, swelling of the face, lips, or tongue -breast enlargement -breathing problems -changes in mood, especially anger, depression, or rage -dark urine -general ill feeling or flu-like symptoms -light-colored stools -loss of appetite, nausea -nausea, vomiting -right upper belly pain -stomach pain -swelling of ankles -too frequent or persistent erections -trouble passing urine or change in the amount of urine -unusually weak or tired -yellowing of the eyes or skin Side  effects that usually do not require medical attention (report to your doctor or health care professional if they continue or are bothersome): -acne -change in sex drive or performance -hair loss -headache This list may not describe all possible side effects. Call your doctor for medical advice about side effects. You may report side effects to FDA at 1-800-FDA-1088. Where should I keep my medicine? Keep out of the reach of children. This medicine can be abused. Keep your medicine in a safe place to protect it from theft. Do not share this medicine with anyone. Selling or giving away this medicine is dangerous and against the law. Store at room temperature between 15 to 30 degrees C (59 to 86 degrees F). Keep closed until use. Protect from heat and light. This medicine is flammable. Avoid exposure to heat, fire, flame, and smoking. Throw away any unused medicine after the expiration date. NOTE: This sheet is a summary. It may not cover all possible information. If you have questions about this medicine, talk to your doctor, pharmacist, or health care provider.  2019 Elsevier/Gold Standard (2013-08-27 08:27:26)    Testosterone Replacement Therapy  Testosterone replacement therapy (TRT) is used to treat men who have a low testosterone level (hypogonadism). Testosterone is a male hormone that is produced in the testicles. It is responsible for typically male characteristics and for maintaining a man's sex drive and the ability to get an erection. Testosterone also supports bone and muscle health. TRT can be a gel, liquid, or patch that you put on your skin. It can also be in the form of a tablet or an injection. In some cases, your health care provider may insert long-acting pellets under your skin. In most men, the level of testosterone starts to decline gradually after age 55. Low testosterone can also be caused by certain medical conditions, medicines, and obesity. Your health care provider can  diagnose hypogonadism with at least two blood tests that are done early in the morning. Low testosterone may not need to be treated. TRT is usually a choice that you make with your health care provider. Your health care provider may recommend TRT if you have low testosterone that is causing symptoms, such as:  Low sex drive.  Erection problems.  Breast enlargement.  Loss of body hair.  Weak muscles or bones.  Shrinking testicles.  Increased body fat.  Low energy.  Hot flashes.  Depression.  Decreased work Systems [employer]. TRT is a lifetime treatment. If you stop treatment, your testosterone will drop, and your symptoms may return. What are  the risks? Testosterone replacement therapy may have side effects, including:  Lower sperm count.  Skin irritation at the application or injection site.  Mouth irritation if you take an oral tablet.  Acne.  Swelling of your legs or feet.  Tender breasts.  Dizziness.  Sleep disturbance.  Mood swings.  Possible increased risk of stroke or heart attack. Testosterone replacement therapy may also increase your risk for prostate cancer or male breast cancer. You should not use TRT if you have either of those conditions. Your health care provider also may not recommend TRT if:  You are suspected of having prostate cancer.  You want to father a child.  You have a high number of red blood cells.  You have untreated sleep apnea.  You have a very large prostate. Supplies needed:  Your health care provider will prescribe the testosterone gel, solution, or medicine that you need. If your health care provider teaches you to do self-injections at home, you will also need: ? Your medicine vial. ? Disposable needles and syringes. ? Alcohol swabs. ? A needle disposal container. ? Adhesive bandages. How to use testosterone replacement therapy Your health care provider will help you find the TRT option that will work best for you based on  your preference, the side effects, and the cost. You may:  Rub testosterone gel on your upper arm or shoulder every day after a shower. This is the most common type of TRT. Do not let women or children come in contact with the gel.  Apply a testosterone solution under your arms once each day.  Place a testosterone patch on your skin once each day.  Dissolve a testosterone tablet in your mouth twice each day.  Have a testosterone pellet inserted under your skin by your health care provider. This will be replaced every 3-6 months.  Use testosterone nasal spray three times each day.  Get testosterone injections. For some types of testosterone, your health care provider will give you this injection. With other types of testosterone, you may be taught to give injections to yourself. The frequency of injections may vary based on the type of testosterone that you receive. Follow these instructions at home:  Take over-the-counter and prescription medicines only as told by your health care provider.  Lose weight if you are overweight. Ask your health care provider to help you start a healthy diet and exercise program to reach and maintain a healthy weight.  Work with your health care provider to treat other medical conditions that may lower your testosterone. These include obesity, high blood pressure, high cholesterol, diabetes, liver disease, kidney disease, and sleep apnea.  Keep all follow-up visits as told by your health care provider. This is important. General recommendations  Discuss all risks and benefits with your health care provider before starting therapy.  Work with your health care provider to check your prostate health and do blood testing before you start therapy.  Do not use any testosterone replacement therapies that are not prescribed by your health care provider or not approved for use in the [place]  Do not use TRT for bodybuilding or to improve sexual performance. TRT  should be used only to treat symptoms of low testosterone.  Return for all repeat prostate checks and blood tests during therapy, as told by your health care provider. Where to find more information Learn more about testosterone replacement therapy from:  [place]: www.urologyhealth.org/urologic-conditions/low-testosterone-(hypogonadism)  Endocrine Society: www.hormone.org/diseases-and-conditions/mens-health/hypogonadism Contact a health care provider if:  You  have side effects from your testosterone replacement therapy.  You continue to have symptoms of low testosterone during treatment.  You develop new symptoms during treatment. Summary  Testosterone replacement therapy is only for men who have low testosterone as determined by blood testing and who have symptoms of low testosterone.  Testosterone replacement therapy should be prescribed only by a health care provider and should be used under the supervision of a health care provider.  You may not be able to take testosterone if you have certain medical conditions, including prostate cancer, male breast cancer, or heart disease.  Testosterone replacement therapy may have side effects and may make some medical conditions worse.  Talk with your health care provider about all the risks and benefits before you start therapy. This information is not intended to replace advice given to you by your health care provider. Make sure you discuss any questions you have with your health care provider. Document Released: 03/01/2016 Document Revised: 03/01/2016 Document Reviewed: 03/01/2016 Elsevier Interactive Patient Education  2019 [employer].

## 2018-08-01 ENCOUNTER — Telehealth: Payer: Self-pay | Admitting: Internal Medicine

## 2018-08-01 NOTE — Telephone Encounter (Signed)
Patient requests new RX for Metformin (PCP used to prescribe the above medication but now Dr. Kelton Pillar prescibes, per patient) sent to [facility]. Patient states he sent Dr. Kelton Pillar a message on MyChart last week with the above request but did not receive a response.

## 2018-08-04 ENCOUNTER — Telehealth: Payer: Self-pay

## 2018-08-04 ENCOUNTER — Other Ambulatory Visit: Payer: Self-pay

## 2018-08-04 DIAGNOSIS — E1142 Type 2 diabetes mellitus with diabetic polyneuropathy: Secondary | ICD-10-CM

## 2018-08-04 MED ORDER — METFORMIN HCL ER 500 MG PO TB24: ORAL_TABLET | ORAL | 2 refills | Status: DC

## 2018-08-04 NOTE — Telephone Encounter (Signed)
Ok to fill 

## 2018-08-04 NOTE — Telephone Encounter (Signed)
Medication has been sent and vm lft for pt

## 2018-08-04 NOTE — Telephone Encounter (Signed)
Patient wife called and stated he will need a prior authorization for Androgel.

## 2018-08-05 ENCOUNTER — Encounter: Payer: Self-pay | Admitting: Internal Medicine

## 2018-08-14 ENCOUNTER — Telehealth: Payer: Self-pay | Admitting: Family Medicine

## 2018-08-14 NOTE — Telephone Encounter (Signed)
P.A. approved til 08/13/21, called pharmacy and went thru for $126.  Called insurance company [phone] to find out why the cost is so much and with pt's plan he has to pay 25% for all medications whether brand or generic.  His deductible has already been met.  Left message for pt.  Only other option would be compounding if pt can not afford and if this is ok with provider, will await call back

## 2018-08-14 NOTE — Telephone Encounter (Signed)
P.A. completed see telephone call

## 2018-08-14 NOTE — Telephone Encounter (Signed)
P.A. TESTOSTERONE GEL

## 2018-08-15 ENCOUNTER — Encounter: Payer: Self-pay | Admitting: Internal Medicine

## 2018-08-15 ENCOUNTER — Ambulatory Visit (INDEPENDENT_AMBULATORY_CARE_PROVIDER_SITE_OTHER): Payer: Managed Care, Other (non HMO) | Admitting: Internal Medicine

## 2018-08-15 VITALS — BP 114/78 | HR 103 | Resp 18 | Ht 73.0 in | Wt 322.6 lb

## 2018-08-15 DIAGNOSIS — E1142 Type 2 diabetes mellitus with diabetic polyneuropathy: Secondary | ICD-10-CM | POA: Diagnosis not present

## 2018-08-15 DIAGNOSIS — R7989 Other specified abnormal findings of blood chemistry: Secondary | ICD-10-CM

## 2018-08-15 MED ORDER — METFORMIN HCL ER 500 MG PO TB24: 1500.0000 mg | ORAL_TABLET | Freq: Every day | ORAL | 11 refills | Status: DC

## 2018-08-15 MED ORDER — DULAGLUTIDE 1.5 MG/0.5ML ~~LOC~~ SOAJ: 1.5000 mg | SUBCUTANEOUS | 3 refills | Status: DC

## 2018-08-15 NOTE — Progress Notes (Signed)
Name: Tim Allen  Age/ Sex: 43 y.o., male   MRN/ DOB: [id number], 1975-07-14     PCP: Girtha Rm, NP-C   Reason for Endocrinology Evaluation: Type 2 Diabetes Mellitus  Initial Endocrine Consultative Visit: 06/16/18    PATIENT IDENTIFIER: Mr. Tim Allen is a 43 y.o. male with a past medical history of HTN, Dyslipidemia,T2DM and OSA. The patient has followed with Endocrinology clinic since 06/16/18 for consultative assistance with management of his diabetes.  DIABETIC HISTORY:  Mr. Tim Allen was diagnosed with T2DM in 08/2017, has been on oral glycemic agents since his diagnosis. His hemoglobin A1c has ranged from 7.1% in 08/2017, peaking at 13.9% in 08/2016.   On his presentation he has been on Metformin XR , farxiga and trulicity.   SUBJECTIVE:   During the last visit (06/16/18): Increased Metformin to 4 tabs daily, we continued the farxiga and trulicity.   Today (08/15/2018): Mr. Tim Allen is for 33-monthfollow-up on his diabetes.  He is accompanied by his wife today.  He checks his blood sugars 0 times daily.The patient has not had hypoglycemic episodes since the last clinic visit. Otherwise, the patient has not required any recent emergency interventions for hypoglycemia and has not had recent hospitalizations secondary to hyper or hypoglycemic episodes.   Patient works third shift and sometimes does not go to bed until 5 AM, wakes up at noon.  Pt with fatigue and decreased libido for the past year. Has fathered 3 kids, the youngest is 162 He has not noted decreased shaving, denies gynecomastia or pain. Denies decrease in testicular size.  His PCP attempted to write him prescription for topical testosterone therapy but patient is not able to afford this.    ROS: As per HPI and as detailed below: Review of Systems  Respiratory: Negative for cough and shortness of breath.   Cardiovascular: Negative  for chest pain and palpitations.  Gastrointestinal: Negative for diarrhea and nausea.  Genitourinary: Negative for frequency.  Endo/Heme/Allergies: Negative for polydipsia.      HOME DIABETES REGIMEN:  Metformin 500 mg XR 3 tablets with breakfast Trulicity 06.22mg weekly Farxiga 5 mg daily     METER DOWNLOAD SUMMARY: Did not bring     HISTORY:  Past Medical History:  Past Medical History:  Diagnosis Date  . Heavy cigarette smoker   . Morbid obesity ([place]   . Sleep apnea   . Type 2 diabetes mellitus ([place]    new diagnosis with HgB A1c 13.9% on 08/27/16  . Vitamin D deficiency 06/16/2018   Past Surgical History:  Past Surgical History:  Procedure Laterality Date  . KNEE SURGERY Right age 43  reconstruction after traumatic injury    Social History:  reports that he has been smoking. He has a 20.00 pack-year smoking history. He has never used smokeless tobacco. He reports that he does not drink alcohol or use drugs. Family History:  Family History  Problem Relation Age of Onset  . Diabetes Father      HOME MEDICATIONS: Allergies as of 08/15/2018   No Known Allergies     Medication List       Accurate as of August 15, 2018  2:57 PM. Always use your most recent med list.        atorvastatin 20 MG tablet Commonly known as:  LIPITOR TAKE 1 TABLET BY MOUTH ONCE DAILY   BLOOD GLUCOSE TEST STRIPS Strp Test twice a day. Pt uses one touch verio flex meter  FARXIGA 5 MG Tabs tablet Generic drug:  dapagliflozin propanediol TAKE 1 TABLET BY MOUTH ONCE DAILY   ibuprofen 200 MG tablet Commonly known as:  ADVIL,MOTRIN Take 200 mg by mouth every 6 (six) hours as needed for moderate pain.   lisinopril 5 MG tablet Commonly known as:  PRINIVIL,ZESTRIL TAKE 1 TABLET BY MOUTH ONCE DAILY   metFORMIN 500 MG 24 hr tablet Commonly known as:  GLUCOPHAGE-XR 4 tablets daily   ONETOUCH DELICA LANCETS FINE Misc Test twice a day   Testosterone 20.25 MG/1.25GM  (1.62%) Gel Use 1 pump on each side daily   TRULICITY 0.93 OI/7.1IW Sopn Generic drug:  Dulaglutide Inject 0.75 mg as directed once a week. Sunday   Vitamin D (Ergocalciferol) 1.25 MG (50000 UT) Caps capsule Commonly known as:  DRISDOL Take 1 capsule (50,000 Units total) by mouth every 7 (seven) days.        OBJECTIVE:   Vital Signs: BP 114/78 (BP Location: Right Arm, Patient Position: Sitting, Cuff Size: Large)   Pulse (!) 103   Resp 18   Ht 6' 1"  (1.854 m)   Wt (!) 322 lb 9.6 oz (146.3 kg)   SpO2 95%   BMI 42.56 kg/m   Wt Readings from Last 3 Encounters:  08/15/18 (!) 322 lb 9.6 oz (146.3 kg)  07/25/18 (!) 321 lb (145.6 kg)  06/16/18 (!) 326 lb 3.2 oz (148 kg)     Exam: General: Pt appears well and is in NAD  Hydration: Well-hydrated with moist mucous membranes and good skin turgor  HEENT: Head: Unremarkable with good dentition. Oropharynx clear without exudate.  Eyes: External eye exam normal without stare, lid lag or exophthalmos.  EOM intact.  PERRL.  Neck: General: Supple without adenopathy. Thyroid: Thyroid size normal.  No goiter or nodules appreciated. No thyroid bruit.  Chest wall:  No evidence of gynecomastia  Lungs: Clear with good BS bilat with no rales, rhonchi, or wheezes  Heart: RRR with normal S1 and S2 and no gallops; no murmurs; no rub  Abdomen: Normoactive bowel sounds, soft, nontender, without masses or organomegaly palpable Declined genital exam  Extremities: No pretibial edema. No tremor.   Neuro: MS is good with appropriate affect, pt is alert and Ox3    DM foot exam:06/16/18 The skin of the feet is intact without sores or ulcerations. The pedal pulses are 2+ on right and 2+ on left. The sensation is intact to a screening 5.07, 10 gram monofilament bilaterally            DATA REVIEWED:  Lab Results  Component Value Date   HGBA1C 8.7 (A) 06/04/2018   HGBA1C 8.6 (A) 12/24/2017   HGBA1C 7.1% 09/09/2017   Lab Results  Component  Value Date   MICROALBUR 0.6 08/27/2016   LDLCALC 32 06/04/2018   CREATININE 0.77 06/04/2018   Lab Results  Component Value Date   MICRALBCREAT 3 08/27/2016     Results for Helbig, Tim Allen (MRN [id number]) as of 08/17/2018 13:04  Ref. Range 06/16/2018 08:40  LH Latest Ref Range: 1.7 - 8.6 mIU/mL 4.7  FSH Latest Ref Range: 1.5 - 12.4 mIU/mL 4.9  Prolactin Latest Ref Range: 4.0 - 15.2 ng/mL 15.1  Testosterone Latest Ref Range: 264 - 916 ng/dL 158 (L)  Testosterone Free Latest Ref Range: 6.8 - 21.5 pg/mL 12.0           ASSESSMENT / PLAN / RECOMMENDATIONS:   1) Type 2 diabetes Mellitus, Poorly controlled, With neuropathic complications - Most recent  A1c of 8.7 7 %. Goal A1c <7.0 %.  Plan:  -Patient is not showing any motivation in taking care of his diabetes, apparently he works a third shift and he is tired as sometimes he only sleeps less than 6 hours at a time.  He has not checked his glucose in a while and when he does this is very sporadic. -I explained to him that glucose data is very important in controlling and making proper adjustments to his regimen and an A1c is only an average over the past 3 months but does not give a daily variations in his glucose. -He has not been able to tolerate 4 tablets of metformin a day, but he has been able to tolerate 3 tablets daily. -Patient admits to noncompliance with his medication regimen.     MEDICATIONS: -Continue metformin 500 mg XR 3 tablets with breakfast -Increase Trulicity to 1.5 mg daily -Continue Farxiga 5 mg daily   EDUCATION / INSTRUCTIONS:  BG monitoring instructions: Patient is instructed to check his blood sugars 2 times a day, fasting and bedtime.  Call [facility] if: BG persistently < 70 or > 300. . I reviewed the Rule of 15 for the treatment of hypoglycemia in detail with the patient. Literature supplied.   2) Low total testosterone:  -Clinically he has fatigue, decreased libido, but denies  any erectile dysfunction, these symptoms are nonspecific and could be caused by a multitude of reasons. -He was noted to have low total testosterone but normal free testosterone, clinically he does not exhibit any signs on exam of hypogonadism, despite declining testicular exam today. -Patient could have a low sex hormone binding globulin which will explain his low testosterone level, and normal free testosterone.,  Because of low sex hormone binding globulin is obesity, his BMI is 42.56 kg/square meter. -Also I have discussed with him the current guidelines and checking testosterone profile, that this has to be early in the morning around 8 AM and the patient has to be fasting, patient does not believe that he was fasting at the time of the 2 testosterone checks. -I will repeat his testosterone profile with the above criteria. - We discussed side effects of testosterone therapy such as erythropoiesis, worsening of sleep apnea that is severe and untreated,  Prostate volumes and serum PSA increase in response to testosterone treatment which might increase BPH and worsen urinary outflow obstruction as well as prostate cancer risk.  - We also discussed there is a possibility of increased cardiovascular risk associated with testosterone use.   F/U in 3 months   Signed electronically by: Mack Guise, MD  [facility]  [facility] [address] [place], [place] [address] Phone: [phone] FAX: [phone]   CC: Girtha Rm, NP-C [place] [address] Phone: [phone]  Fax: [phone]  Return to Endocrinology clinic as below: Future Appointments  Date Time Provider [place]  08/15/2018  3:00 PM Junie Engram, Melanie Crazier, MD [facility] None

## 2018-08-15 NOTE — Patient Instructions (Addendum)
-   Continue  Metformin at  3 tablets with breakfast - Increase Trulicity to 1.5  mg weekly  - Continue Farxiga 5 mg daily     - Check sugars twice a day (fasting and bedtime) - Exercise (brisk walking ) 150 minutes per week  - Choose healthy, lower carb lower calorie snacks: toss salad, cooked vegetables, cottage cheese, peanut butter, low fat cheese / string cheese, lower sodium deli meat, tuna salad or chicken salad

## 2018-08-17 DIAGNOSIS — R7989 Other specified abnormal findings of blood chemistry: Secondary | ICD-10-CM | POA: Insufficient documentation

## 2018-08-18 ENCOUNTER — Other Ambulatory Visit (INDEPENDENT_AMBULATORY_CARE_PROVIDER_SITE_OTHER): Payer: Managed Care, Other (non HMO)

## 2018-08-18 DIAGNOSIS — R7989 Other specified abnormal findings of blood chemistry: Secondary | ICD-10-CM

## 2018-08-18 LAB — FOLLICLE STIMULATING HORMONE: FSH: 6.4 m[IU]/mL (ref 1.4–18.1)

## 2018-08-18 LAB — LUTEINIZING HORMONE: LH: 4.59 m[IU]/mL (ref 1.50–9.30)

## 2018-08-21 LAB — PROLACTIN: Prolactin: 11.5 ng/mL (ref 2.0–18.0)

## 2018-08-21 LAB — SEX HORMONE BINDING GLOBULIN: Sex Hormone Binding: 10 nmol/L (ref 10–50)

## 2018-08-21 LAB — TESTOSTERONE, TOTAL, LC/MS/MS: Testosterone, Total, LC-MS-MS: 219 ng/dL — ABNORMAL LOW (ref 250–1100)

## 2018-08-25 LAB — TESTOSTERONE FREE MS/DIALYSIS
Free Testosterone, Serum: 79 pg/mL
Testosterone, Serum (Total): 233 ng/dL — ABNORMAL LOW
Testosterone-% Free: 3.4 %

## 2018-08-26 NOTE — Telephone Encounter (Signed)
Left another message for pt.

## 2018-09-07 ENCOUNTER — Other Ambulatory Visit: Payer: Self-pay | Admitting: Family Medicine

## 2018-09-07 DIAGNOSIS — E119 Type 2 diabetes mellitus without complications: Secondary | ICD-10-CM

## 2018-09-08 ENCOUNTER — Other Ambulatory Visit: Payer: Self-pay | Admitting: Family Medicine

## 2018-09-08 DIAGNOSIS — E1165 Type 2 diabetes mellitus with hyperglycemia: Principal | ICD-10-CM

## 2018-09-08 DIAGNOSIS — IMO0001 Reserved for inherently not codable concepts without codable children: Secondary | ICD-10-CM

## 2018-09-30 ENCOUNTER — Encounter: Payer: Self-pay | Admitting: Family Medicine

## 2018-10-01 ENCOUNTER — Ambulatory Visit (INDEPENDENT_AMBULATORY_CARE_PROVIDER_SITE_OTHER): Payer: Managed Care, Other (non HMO)

## 2018-10-01 ENCOUNTER — Other Ambulatory Visit: Payer: Self-pay

## 2018-10-01 ENCOUNTER — Ambulatory Visit
Admission: EM | Admit: 2018-10-01 | Discharge: 2018-10-01 | Disposition: A | Discharge status: Home / Self Care | Payer: Managed Care, Other (non HMO) | Attending: Emergency Medicine | Admitting: Emergency Medicine

## 2018-10-01 ENCOUNTER — Ambulatory Visit: Admit: 2018-10-01 | Discharge status: Absent without leave | Payer: Self-pay

## 2018-10-01 DIAGNOSIS — I10 Essential (primary) hypertension: Secondary | ICD-10-CM

## 2018-10-01 DIAGNOSIS — E119 Type 2 diabetes mellitus without complications: Secondary | ICD-10-CM | POA: Diagnosis not present

## 2018-10-01 DIAGNOSIS — S161XXA Strain of muscle, fascia and tendon at neck level, initial encounter: Secondary | ICD-10-CM

## 2018-10-01 DIAGNOSIS — F17200 Nicotine dependence, unspecified, uncomplicated: Secondary | ICD-10-CM

## 2018-10-01 DIAGNOSIS — S46912A Strain of unspecified muscle, fascia and tendon at shoulder and upper arm level, left arm, initial encounter: Secondary | ICD-10-CM | POA: Diagnosis not present

## 2018-10-01 MED ORDER — CYCLOBENZAPRINE HCL 5 MG PO TABS: 5.0000 mg | ORAL_TABLET | Freq: Every day | ORAL | 0 refills | Status: DC

## 2018-10-01 MED ORDER — MELOXICAM 7.5 MG PO TABS: 7.5000 mg | ORAL_TABLET | Freq: Every day | ORAL | 0 refills | Status: DC

## 2018-10-01 NOTE — Discharge Instructions (Signed)
Light and regular activity as tolerated, see exercises provided.  Daily meloxicam, take with food. Don't take additional aleve or ibuprofen.  May use muscle relaxer at night. May cause drowsiness. Please do not take if driving or drinking alcohol.  Heat application to affected muscles. Ice application to left shoulder.  Follow up with your primary care provider as needed for persistent symptoms. I would give at least 2-4 weeks for improvement.
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
## 2018-10-08 ENCOUNTER — Ambulatory Visit: Payer: Managed Care, Other (non HMO) | Admitting: Family Medicine

## 2018-10-14 ENCOUNTER — Other Ambulatory Visit: Payer: Self-pay

## 2018-10-14 ENCOUNTER — Encounter: Payer: Self-pay | Admitting: Internal Medicine

## 2018-10-14 MED ORDER — ONETOUCH DELICA LANCETS 33G MISC: 1.0000 | Freq: Two times a day (BID) | 11 refills | Status: DC

## 2018-10-14 MED ORDER — BLOOD GLUCOSE TEST VI STRP: ORAL_STRIP | 4 refills | Status: DC

## 2018-10-20 ENCOUNTER — Other Ambulatory Visit: Payer: Self-pay

## 2018-10-20 DIAGNOSIS — E1165 Type 2 diabetes mellitus with hyperglycemia: Principal | ICD-10-CM

## 2018-10-20 DIAGNOSIS — E559 Vitamin D deficiency, unspecified: Secondary | ICD-10-CM

## 2018-10-20 DIAGNOSIS — IMO0001 Reserved for inherently not codable concepts without codable children: Secondary | ICD-10-CM

## 2018-10-20 MED ORDER — VITAMIN D (ERGOCALCIFEROL) 1.25 MG (50000 UNIT) PO CAPS: 50000.0000 [IU] | ORAL_CAPSULE | ORAL | 0 refills | Status: DC

## 2018-10-20 MED ORDER — DULAGLUTIDE 1.5 MG/0.5ML ~~LOC~~ SOAJ: 1.5000 mg | SUBCUTANEOUS | 3 refills | Status: DC

## 2018-10-20 MED ORDER — DAPAGLIFLOZIN PROPANEDIOL 5 MG PO TABS: 5.0000 mg | ORAL_TABLET | Freq: Every day | ORAL | 1 refills | Status: DC

## 2018-10-20 MED ORDER — ONETOUCH DELICA LANCETS 33G MISC: 1.0000 | Freq: Two times a day (BID) | 11 refills | Status: DC

## 2018-11-13 ENCOUNTER — Encounter: Payer: Self-pay | Admitting: Internal Medicine

## 2018-11-13 ENCOUNTER — Ambulatory Visit (INDEPENDENT_AMBULATORY_CARE_PROVIDER_SITE_OTHER): Payer: Managed Care, Other (non HMO) | Admitting: Internal Medicine

## 2018-11-13 ENCOUNTER — Other Ambulatory Visit: Payer: Self-pay

## 2018-11-13 DIAGNOSIS — IMO0001 Reserved for inherently not codable concepts without codable children: Secondary | ICD-10-CM

## 2018-11-13 DIAGNOSIS — R7989 Other specified abnormal findings of blood chemistry: Secondary | ICD-10-CM

## 2018-11-13 DIAGNOSIS — E1165 Type 2 diabetes mellitus with hyperglycemia: Secondary | ICD-10-CM

## 2018-11-13 MED ORDER — DEXCOM G6 SENSOR MISC: 1.0000 | 6 refills | Status: DC

## 2018-11-13 MED ORDER — DEXCOM G6 TRANSMITTER MISC: 1.0000 | 3 refills | Status: DC

## 2018-11-13 MED ORDER — DAPAGLIFLOZIN PROPANEDIOL 10 MG PO TABS: 10.0000 mg | ORAL_TABLET | Freq: Every day | ORAL | 11 refills | Status: DC

## 2018-11-13 MED ORDER — DEXCOM G6 RECEIVER DEVI: 1.0000 | 0 refills | Status: DC
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
## 2018-11-27 ENCOUNTER — Other Ambulatory Visit: Payer: Self-pay | Admitting: Internal Medicine

## 2018-11-27 MED ORDER — METFORMIN HCL 1000 MG PO TABS: ORAL_TABLET | ORAL | 3 refills | Status: DC

## 2018-11-27 NOTE — Progress Notes (Signed)
Per [employer], there are nationwide recalls on ER formulations.   Will switch to regular release   Abby Nena Jordan, MD  [facility] End[facility]  [facility] [address] [place], [place] [address] Phone: [phone] FAX: [phone]

## 2019-01-02 ENCOUNTER — Other Ambulatory Visit: Payer: Self-pay

## 2019-01-02 ENCOUNTER — Telehealth: Payer: Self-pay | Admitting: Internal Medicine

## 2019-01-02 MED ORDER — ACCU-CHEK AVIVA DEVI: 0 refills | Status: DC

## 2019-01-02 MED ORDER — ACCU-CHEK SOFT TOUCH LANCETS MISC: 12 refills | Status: DC

## 2019-01-02 MED ORDER — BLOOD GLUCOSE TEST VI STRP: ORAL_STRIP | 4 refills | Status: DC

## 2019-01-02 MED ORDER — ACCU-CHEK AVIVA PLUS VI STRP: ORAL_STRIP | 12 refills | Status: DC

## 2019-01-02 NOTE — Telephone Encounter (Signed)
Sent what is covered and D/C'd onetouch

## 2019-01-02 NOTE — Telephone Encounter (Signed)
MEDICATION: Glucose Blood (BLOOD GLUCOSE TEST STRIPS) STRP  PHARMACY:  [facility]  IS THIS A 90 DAY SUPPLY :   IS PATIENT OUT OF MEDICATION:   IF NOT; HOW MUCH IS LEFT:   LAST APPOINTMENT DATE: @5 /21/2020  NEXT APPOINTMENT DATE:@8 /21/2020  DO WE HAVE YOUR PERMISSION TO LEAVE A DETAILED MESSAGE:  OTHER COMMENTS:    **Let patient know to contact pharmacy at the end of the day to make sure medication is ready. **  ** Please notify patient to allow 48-72 hours to process**  **Encourage patient to contact the pharmacy for refills or they can request refills through [facility]**

## 2019-01-02 NOTE — Telephone Encounter (Signed)
Pharmacy called back stating that the insurance no longer covers one touch but will cover accu-check. Patient will need a new meter also.  Please Advise, Thanks

## 2019-02-02 ENCOUNTER — Other Ambulatory Visit: Payer: Self-pay

## 2019-02-02 ENCOUNTER — Telehealth: Payer: Self-pay | Admitting: Family Medicine

## 2019-02-02 DIAGNOSIS — E559 Vitamin D deficiency, unspecified: Secondary | ICD-10-CM

## 2019-02-02 DIAGNOSIS — E119 Type 2 diabetes mellitus without complications: Secondary | ICD-10-CM

## 2019-02-02 MED ORDER — LISINOPRIL 5 MG PO TABS: 5.0000 mg | ORAL_TABLET | Freq: Every day | ORAL | 0 refills | Status: DC

## 2019-02-02 MED ORDER — VITAMIN D (ERGOCALCIFEROL) 1.25 MG (50000 UNIT) PO CAPS: 50000.0000 [IU] | ORAL_CAPSULE | ORAL | 0 refills | Status: DC

## 2019-02-02 MED ORDER — ATORVASTATIN CALCIUM 20 MG PO TABS: 20.0000 mg | ORAL_TABLET | Freq: Every day | ORAL | 0 refills | Status: DC

## 2019-02-02 NOTE — Telephone Encounter (Signed)
Needs visit and labs to follow up on HTN and cholesterol. Please find out what his BP readings are at home. Ok to give him 30 or 90 days for now and schedule him for a visit soon.

## 2019-02-02 NOTE — Telephone Encounter (Signed)
Pharmacy left message and said pt needs refill on Lisinopril and Lipitor

## 2019-02-02 NOTE — Telephone Encounter (Signed)
Patient stated he will call back to schedule his appointment

## 2019-02-13 ENCOUNTER — Ambulatory Visit: Payer: Managed Care, Other (non HMO) | Admitting: Internal Medicine

## 2019-03-20 ENCOUNTER — Ambulatory Visit: Payer: Managed Care, Other (non HMO) | Admitting: Family Medicine

## 2019-04-22 ENCOUNTER — Other Ambulatory Visit: Payer: Self-pay

## 2019-04-22 ENCOUNTER — Telehealth: Payer: Self-pay

## 2019-04-22 MED ORDER — TRULICITY 1.5 MG/0.5ML ~~LOC~~ SOAJ: 1.5000 mg | SUBCUTANEOUS | 3 refills | Status: DC

## 2019-04-22 NOTE — Telephone Encounter (Signed)
MEDICATION: Dulaglutide (TRULICITY) 1.5 XV/4.0GQ SOPN dapagliflozin propanediol (FARXIGA) 10 MG TABS tablet  PH[facility]ACY:  [facility]   IS THIS A 90 DAY SUPPLY :   IS PATIENT OUT OF MEDICATION:   IF NOT; HOW MUCH IS LEFT:   LAST APPOINTMENT DATE: @7 /03/2019  NEXT APPOINTMENT DATE:@11 /23/2020  DO WE HAVE YOUR PERMISSION TO LEAVE A DETAILED MESSAGE:  OTHER COMMENTS:    **Let patient know to contact pharmacy at the end of the day to make sure medication is ready. **  ** Please notify patient to allow 48-72 hours to process**  **Encourage patient to contact the pharmacy for refills or they can request refills through [facility]**

## 2019-04-22 NOTE — Telephone Encounter (Signed)
Rx has been sent to requested pharmacy

## 2019-04-23 ENCOUNTER — Telehealth: Payer: Self-pay | Admitting: Internal Medicine

## 2019-04-23 NOTE — Telephone Encounter (Signed)
Regarding the AndroGel, the last free testosterone that I see in the system was normal.  I will let Dr. Kelton Pillar see how to go about this, may need to try just generic testosterone gel or may need injections of testosterone if he is even approved for it. For the [place] and Trulicity, I would probably try the PA.

## 2019-04-23 NOTE — Telephone Encounter (Signed)
That sounds great Olen Cordial I will work on PA for Trulicity now- thank you

## 2019-04-23 NOTE — Telephone Encounter (Signed)
Fione with [facility] ph# [phone] called re: status of PA for Trulicity and [place]. Please call the above contact to advise.

## 2019-04-23 NOTE — Telephone Encounter (Signed)
I have not seen any paperwork either. Do you want to do one, and me take the other?

## 2019-04-23 NOTE — Telephone Encounter (Signed)
Hey team- I am not aware of a PA for these medications has anyone seen this so that I can take care of it? Ty!!

## 2019-04-23 NOTE — Telephone Encounter (Signed)
[name] picked up this paperwork this morning

## 2019-04-23 NOTE — Telephone Encounter (Signed)
Patient called back wanting to add Androgel to this refill- PA situation

## 2019-04-23 NOTE — Telephone Encounter (Signed)
Noted  

## 2019-04-23 NOTE — Telephone Encounter (Signed)
Dr. Cruzita Lederer, is there something that would be advisable to change these medications to before attempting PA?

## 2019-04-27 NOTE — Telephone Encounter (Signed)
Called and left pt a VM making him aware he will need to contact original provider for Androgel-I also let him know that instead of doing PA Dr. Kelton Pillar has stated patient assistance would be better for Trulicity and [place]

## 2019-04-27 NOTE — Telephone Encounter (Signed)
Called [place] [employer] call center to attempt to complete PA for Trulicity and [place].  tracks stated that the pt only has family planning benefits, not prescription. I then contacted [facility]ella Scheuermann and they stated that the pt's coverage lapsed on 04/25/2019. Pt was then notified of this and he stated that he does not have any other coverage at this time. Pt was informed that unfortunately, we cannot do a PA if the pt does not have insurance coverage of some type. Pt was informed that as of this time since he does not have insurance coverage, his only option would be to purchase the medication out of pocket. The pt laughed and stated "okay". Pt was encouraged to contact this office if he should get insurance coverage and we will attempt to help in any way we can. Pt verbalized understanding.

## 2019-05-18 ENCOUNTER — Ambulatory Visit: Payer: Managed Care, Other (non HMO) | Admitting: Internal Medicine

## 2019-06-08 ENCOUNTER — Other Ambulatory Visit: Payer: Self-pay

## 2019-06-08 ENCOUNTER — Ambulatory Visit
Admission: EM | Admit: 2019-06-08 | Discharge: 2019-06-08 | Disposition: A | Discharge status: Home / Self Care | Payer: Self-pay | Attending: Emergency Medicine | Admitting: Emergency Medicine

## 2019-06-08 ENCOUNTER — Ambulatory Visit (INDEPENDENT_AMBULATORY_CARE_PROVIDER_SITE_OTHER): Payer: Self-pay

## 2019-06-08 ENCOUNTER — Encounter: Payer: Self-pay | Admitting: Emergency Medicine

## 2019-06-08 ENCOUNTER — Other Ambulatory Visit: Payer: Self-pay | Admitting: Family Medicine

## 2019-06-08 DIAGNOSIS — S61242A Puncture wound with foreign body of right middle finger without damage to nail, initial encounter: Secondary | ICD-10-CM

## 2019-06-08 DIAGNOSIS — S61232A Puncture wound without foreign body of right middle finger without damage to nail, initial encounter: Secondary | ICD-10-CM

## 2019-06-08 DIAGNOSIS — S61209A Unspecified open wound of unspecified finger without damage to nail, initial encounter: Secondary | ICD-10-CM

## 2019-06-08 DIAGNOSIS — Z23 Encounter for immunization: Secondary | ICD-10-CM

## 2019-06-08 DIAGNOSIS — W450XXA Nail entering through skin, initial encounter: Secondary | ICD-10-CM

## 2019-06-08 MED ORDER — AMOXICILLIN-POT CLAVULANATE 875-125 MG PO TABS: 1.0000 | ORAL_TABLET | Freq: Two times a day (BID) | ORAL | 0 refills | Status: DC

## 2019-06-08 MED ORDER — TETANUS-DIPHTH-ACELL PERTUSSIS 5-2.5-18.5 LF-MCG/0.5 IM SUSP
0.5000 mL | Freq: Once | INTRAMUSCULAR | Status: AC
  Administered 2019-06-08: 12:00:00 0.5 mL via INTRAMUSCULAR

## 2019-06-08 NOTE — ED Provider Notes (Signed)
[name]    CSN: [id number] Arrival date & time: 06/08/19  1118      History   Chief Complaint Chief Complaint  Patient presents with  . Puncture Wound    HPI Tim Allen is a 43 y.o. male with history obesity, tobacco use, sleep apnea, type 2 diabetes presenting for puncture to right middle finger on Wednesday.  States this is with a rusty nail which he was able to remove.  Patient unsure of tetanus status.  Endorsing swelling, pain.  No numbness, decreased ROM, fever.  Patient has been washing hands daily, keeping clean, dry, wrapped.    Past Medical History:  Diagnosis Date  . Heavy cigarette smoker   . Morbid obesity ([place])   . Sleep apnea   . Type 2 diabetes mellitus ([place])    new diagnosis with HgB A1c 13.9% on 08/27/16  . Vitamin D deficiency 06/16/2018    Patient Active Problem List   Diagnosis Date Noted  . Low testosterone in male 08/17/2018  . Hypogonadism in male 07/25/2018  . Vitamin D deficiency 06/16/2018  . Anhedonia 06/04/2018  . Fatigue 06/04/2018  . Uncontrolled type 2 diabetes mellitus with hyperglycemia ([place]) 06/04/2018  . Decreased libido 06/04/2018  . ASCVD (arteriosclerotic cardiovascular disease) 08/28/2016  . Type 2 diabetes mellitus with diabetic polyneuropathy, without long-term current use of insulin ([place]) 08/27/2016  . Morbid obesity ([place]) 08/27/2016  . Heavy smoker 08/27/2016  . OSA on CPAP 09/28/2014  . BMI 45.0-49.9, adult ([place]) 09/28/2014  . Hyperglycemia 09/28/2014    Past Surgical History:  Procedure Laterality Date  . KNEE SURGERY Right age 83   reconstruction after traumatic injury       Home Medications    Prior to Admission medications   Medication Sig Start Date End Date Taking? Authorizing Provider  amoxicillin-clavulanate (AUGMENTIN) 875-125 MG tablet Take 1 tablet by mouth every 12 (twelve) hours. 06/08/19   Hall-Potvin, [place], PA-C  atorvastatin (LIPITOR) 20 MG tablet TAKE 1 TABLET (20 MG  TOTAL) BY MOUTH DAILY.  06/08/19   Henson, [name] L, NP-C  Blood Glucose Monitoring Suppl (ACCU-CHEK AVIVA) device Use as instructed to test blood sugars 2 times daily DX E11.42 01/02/19 01/02/20  Shamleffer, Melanie Crazier, MD  Continuous Blood Gluc Receiver (DEXCOM G6 RECEIVER) DEVI 1 Device by Does not apply route as directed. 11/13/18   Shamleffer, Melanie Crazier, MD  Continuous Blood Gluc Sensor (DEXCOM G6 SENSOR) MISC 1 Device by Does not apply route as directed. 11/13/18   Shamleffer, Melanie Crazier, MD  Continuous Blood Gluc Transmit (DEXCOM G6 TRANSMITTER) MISC 1 Device by Does not apply route as directed. 11/13/18   Shamleffer, Melanie Crazier, MD  cyclobenzaprine (FLEXERIL) 5 MG tablet Take 1 tablet (5 mg total) by mouth at bedtime. 10/01/18   Zigmund Gottron, NP  dapagliflozin propanediol (FARXIGA) 10 MG TABS tablet Take 10 mg by mouth daily. 11/13/18   Shamleffer, Melanie Crazier, MD  Dulaglutide (TRULICITY) 1.5 YH/0.6CB SOPN Inject 1.5 mg into the skin once a week. 04/22/19   Shamleffer, Melanie Crazier, MD  glucose blood (ACCU-CHEK AVIVA PLUS) test strip Use as instructed to test blood sugars 2 times daily DX E11.42 01/02/19   Shamleffer, Melanie Crazier, MD  ibuprofen (ADVIL,MOTRIN) 200 MG tablet Take 200 mg by mouth every 6 (six) hours as needed for moderate pain.    [provider]  Lancets (ACCU-CHEK SOFT TOUCH) lancets Use as instructed to test blood sugars 2 times daily DX E11.42 01/02/19  Shamleffer, Melanie Crazier, MD  lisinopril (ZESTRIL) 5 MG tablet Take 1 tablet (5 mg total) by mouth daily. 02/02/19   Henson, [name] L, NP-C  meloxicam (MOBIC) 7.5 MG tablet Take 1 tablet (7.5 mg total) by mouth daily. 10/01/18   Zigmund Gottron, NP  metFORMIN (GLUCOPHAGE) 1000 MG tablet Take 1 tablet (1,000 mg total) by mouth daily with breakfast AND 0.5 tablets (500 mg total) daily with supper. 11/27/18   Shamleffer, Melanie Crazier, MD  Vitamin D, Ergocalciferol, (DRISDOL) 1.25 MG (50000 UT)  CAPS capsule Take 1 capsule (50,000 Units total) by mouth every 7 (seven) days. 02/02/19   Shamleffer, Melanie Crazier, MD    Family History Family History  Problem Relation Age of Onset  . Diabetes Father     Social History Social History   Tobacco Use  . Smoking status: Current Every Day Smoker    Packs/day: 2.00    Years: 10.00    Pack years: 20.00  . Smokeless tobacco: Never Used  Substance Use Topics  . Alcohol use: No    Alcohol/week: 0.0 standard drinks  . Drug use: No     Allergies   Patient has no known allergies.   Review of Systems Review of Systems  Constitutional: Negative for fatigue and fever.  Respiratory: Negative for cough and shortness of breath.   Cardiovascular: Negative for chest pain and palpitations.  Gastrointestinal: Negative for abdominal pain, diarrhea and vomiting.  Musculoskeletal: Negative for arthralgias and myalgias.  Skin: Positive for wound. Negative for rash.  Neurological: Negative for speech difficulty and headaches.  All other systems reviewed and are negative.    Physical Exam Triage Vital Signs ED Triage Vitals  Enc Vitals Group     BP 06/08/19 1143 129/84     Pulse Rate 06/08/19 1143 97     Resp 06/08/19 1143 18     Temp 06/08/19 1143 98.2 F (36.8 C)     Temp Source 06/08/19 1143 Temporal     SpO2 06/08/19 1143 95 %     Weight --      Height --      Head Circumference --      Peak Flow --      Pain Score 06/08/19 1144 4     Pain Loc --      Pain Edu? --      Excl. in [place]? --    No data found.  Updated Vital Signs BP 129/84 (BP Location: Left Arm)   Pulse 97   Temp 98.2 F (36.8 C) (Temporal)   Resp 18   SpO2 95%   Visual Acuity Right Eye Distance:   Left Eye Distance:   Bilateral Distance:    Right Eye Near:   Left Eye Near:    Bilateral Near:     Physical Exam Constitutional:      General: He is not in acute distress. HENT:     Head: Normocephalic and atraumatic.  Eyes:     General: No  scleral icterus.    Pupils: Pupils are equal, round, and reactive to light.  Cardiovascular:     Rate and Rhythm: Normal rate.  Pulmonary:     Effort: Pulmonary effort is normal. No respiratory distress.     Breath sounds: No wheezing.  Musculoskeletal:        General: Swelling, tenderness and signs of injury present. No deformity. Normal range of motion.  Skin:    Comments: Superficial punctate wound in between MCP, PIP of right third  digit.  Scant serous discharge and surrounding erythema with TTP noted.  No warmth or streaking.  Neurological:     Mental Status: He is alert and oriented to person, place, and time.      UC Treatments / Results  Labs (all labs ordered are listed, but only abnormal results are displayed) Labs Reviewed - No data to display  EKG   Radiology DG Finger Middle Right  Result Date: 06/08/2019 CLINICAL DATA:  43 year old male status post puncture wound from nail on palmar aspect of phalanx 5 days ago. Recurrent bleeding. Query foreign body. EXAM: RIGHT MIDDLE FINGER 2+V COMPARISON:  None. FINDINGS: Generalized soft tissue swelling about the right 3rd finger. No soft tissue gas. No radiopaque foreign body identified. Underlying osseous structures and joint spaces appear within normal limits. Normal background bone mineralization. IMPRESSION: Right middle finger generalized soft tissue swelling with no radiopaque foreign body or acute osseous abnormality identified. Electronically Signed   By: Genevie Ann M.D.   On: 06/08/2019 12:26    Procedures Procedures (including critical care time)  Medications Ordered in UC Medications  Tdap (BOOSTRIX) injection 0.5 mL (0.5 mLs Intramuscular Given 06/08/19 1214)    Initial Impression / Assessment and Plan / UC Course  I have reviewed the triage vital signs and the nursing notes.  Pertinent labs & imaging results that were available during my care of the patient were reviewed by me and considered in my medical  decision making (see chart for details).     X-ray of right third finger obtained in office, reviewed by me radiology: No soft tissue gas, radiopaque foreign body, or fracture identified.  Significant for soft tissue swelling.  Reviewed findings with patient who verbalized understanding.  Wound thoroughly irrigated, patient given tetanus booster.  Given surrounding erythema and tenderness after foreign body punctate wound in type II diabetic, smoking patient, will start Augmentin.  Return precautions discussed, patient verbalized understanding and is agreeable to plan. Final Clinical Impressions(s) / UC Diagnoses   Final diagnoses:  Finger wound, simple, open, initial encounter     Discharge Instructions     Take antibiotic as prescribed. Keep wound clean and dry. Your tetanus was updated today. Return for worsening pain, swelling, pus, fever, difficulty moving finger.    ED Prescriptions    Medication Sig Dispense Auth. Provider   amoxicillin-clavulanate (AUGMENTIN) 875-125 MG tablet Take 1 tablet by mouth every 12 (twelve) hours. 14 tablet Hall-Potvin, [place], PA-C     PDMP not reviewed this encounter.   Hall-Potvin, [place], [place] 06/08/19 1411

## 2019-06-08 NOTE — ED Triage Notes (Signed)
Pt presents to [facility] for assessment of puncture to middle finger of the right hand with a rusty nail while picking up a pallet.  Patient last tetanus unknown.  Swelling to the area noted.

## 2019-06-08 NOTE — Discharge Instructions (Addendum)
Take antibiotic as prescribed. Keep wound clean and dry. Your tetanus was updated today. Return for worsening pain, swelling, pus, fever, difficulty moving finger.

## 2019-06-08 NOTE — ED Notes (Signed)
Patient able to ambulate independently  

## 2019-07-29 ENCOUNTER — Other Ambulatory Visit: Payer: Self-pay

## 2019-07-31 ENCOUNTER — Ambulatory Visit (INDEPENDENT_AMBULATORY_CARE_PROVIDER_SITE_OTHER): Payer: Medicaid Other | Admitting: Internal Medicine

## 2019-07-31 ENCOUNTER — Other Ambulatory Visit: Payer: Self-pay

## 2019-07-31 ENCOUNTER — Telehealth: Payer: Self-pay

## 2019-07-31 VITALS — BP 144/72 | HR 95 | Temp 98.4°F | Ht 73.0 in | Wt 321.0 lb

## 2019-07-31 DIAGNOSIS — E1165 Type 2 diabetes mellitus with hyperglycemia: Secondary | ICD-10-CM

## 2019-07-31 LAB — POCT GLYCOSYLATED HEMOGLOBIN (HGB A1C): Hemoglobin A1C: 8.1 % — AB (ref 4.0–5.6)

## 2019-07-31 LAB — BASIC METABOLIC PANEL
BUN: 9 mg/dL (ref 6–23)
CO2: 28 mEq/L (ref 19–32)
Calcium: 9.9 mg/dL (ref 8.4–10.5)
Chloride: 101 mEq/L (ref 96–112)
Creatinine, Ser: 0.86 mg/dL (ref 0.40–1.50)
GFR: 116.97 mL/min (ref >60.00)
Glucose, Bld: 244 mg/dL — ABNORMAL HIGH (ref 70–99)
Potassium: 4.1 mEq/L (ref 3.5–5.1)
Sodium: 136 mEq/L (ref 135–145)

## 2019-07-31 LAB — GLUCOSE, POCT (MANUAL RESULT ENTRY): POC Glucose: 255 mg/dl — AB (ref 70–99)

## 2019-07-31 MED ORDER — ONETOUCH VERIO W/DEVICE KIT: 1.0000 | PACK | Freq: Every day | 0 refills | Status: DC

## 2019-07-31 MED ORDER — METFORMIN HCL 1000 MG PO TABS: 1000.0000 mg | ORAL_TABLET | Freq: Every day | ORAL | 3 refills | Status: DC

## 2019-07-31 MED ORDER — TRULICITY 1.5 MG/0.5ML ~~LOC~~ SOAJ: 1.5000 mg | SUBCUTANEOUS | 11 refills | Status: DC

## 2019-07-31 MED ORDER — ONETOUCH VERIO VI STRP: ORAL_STRIP | 12 refills | Status: DC

## 2019-07-31 MED ORDER — FARXIGA 10 MG PO TABS: 10.0000 mg | ORAL_TABLET | Freq: Every day | ORAL | 11 refills | Status: DC

## 2019-07-31 MED ORDER — ONETOUCH ULTRASOFT LANCETS MISC: 12 refills | Status: DC

## 2019-07-31 NOTE — Telephone Encounter (Signed)
Please advise 

## 2019-07-31 NOTE — Patient Instructions (Addendum)
-   Please stop by the lab today to check on your kidney. Its important to check your kidneys when you are on Farxiga    - Continue  Metformin1000 mg daily  - Restart  Trulicity 1.5  mg weekly  - Continue Farxiga 10 mg daily      HOW TO TREAT LOW BLOOD SUGARS (Blood sugar LESS THAN 70 MG/DL)  Please follow the RULE OF 15 for the treatment of hypoglycemia treatment (when your (blood sugars are less than 70 mg/dL)    STEP 1: Take 15 grams of carbohydrates when your blood sugar is low, which includes:   3-4 GLUCOSE TABS  OR  3-4 OZ OF JUICE OR REGULAR SODA OR  ONE TUBE OF GLUCOSE GEL     STEP 2: RECHECK blood sugar in 15 MINUTES STEP 3: If your blood sugar is still low at the 15 minute recheck --> then, go back to STEP 1 and treat AGAIN with another 15 grams of carbohydrates.

## 2019-07-31 NOTE — Progress Notes (Signed)
Name: Tim Allen  Age/ Sex: 44 y.o., male   MRN/ DOB: [id number], Oct 22, 1975     PCP: Girtha Rm, NP-C   Reason for Endocrinology Evaluation: Type 2 Diabetes Mellitus  Initial Endocrine Consultative Visit: 06/16/18    PATIENT IDENTIFIER: Tim Allen is a 44 y.o. male with a past medical history of HTN, Dyslipidemia,T2DM and OSA. The patient has followed with Endocrinology clinic since 06/16/18 for consultative assistance with management of his diabetes.  DIABETIC HISTORY:  Tim Allen was diagnosed with T2DM in 08/2017, has been on oral glycemic agents since his diagnosis. His hemoglobin A1c has ranged from 7.1% in 08/2017, peaking at 13.9% in 08/2016.   On his presentation he has been on Metformin XR , farxiga and trulicity.   SUBJECTIVE:   During the last visit (06/16/18): Increased Metformin to 4 tabs daily, we continued the farxiga and trulicity.   Today (07/31/2019): Tim Allen is for a follow-up on his diabetes.  He was without health insurance for few months in 2020.  He check his blood sugars 1 times daily except recently when he lost his meter.Otherwise, the patient has not required any recent emergency interventions for hypoglycemia and has not had recent hospitalizations secondary to hyper or hypoglycemic episodes.   ROS: As per HPI and as detailed below: Review of Systems  Respiratory: Negative for cough and shortness of breath.   Cardiovascular: Negative for chest pain and palpitations.  Gastrointestinal: Negative for diarrhea and nausea.  Genitourinary: Negative for frequency.  Endo/Heme/Allergies: Negative for polydipsia.      HOME DIABETES REGIMEN:  Metformin 9024 mg daily Trulicity 0.97 mg weekly- has not taken  Farxiga 10 mg daily     METER DOWNLOAD SUMMARY: Did not bring     HISTORY:  Past Medical History:  Past Medical History:  Diagnosis Date  . Heavy cigarette smoker   . Morbid obesity ([place])   . Sleep apnea   . Type 2 diabetes  mellitus ([place])    new diagnosis with HgB A1c 13.9% on 08/27/16  . Vitamin D deficiency 06/16/2018   Past Surgical History:  Past Surgical History:  Procedure Laterality Date  . KNEE SURGERY Right age 61   reconstruction after traumatic injury    Social History:  reports that he has been smoking. He has a 20.00 pack-year smoking history. He has never used smokeless tobacco. He reports that he does not drink alcohol or use drugs. Family History:  Family History  Problem Relation Age of Onset  . Diabetes Father      HOME MEDICATIONS: Allergies as of 07/31/2019   No Known Allergies     Medication List       Accurate as of July 31, 2019  4:17 PM. If you have any questions, ask your nurse or doctor.        STOP taking these medications   Accu-Chek Aviva device Replaced by: Roma Schanz w/Device Kit Stopped by: Jeannetta Nap, CMA   amoxicillin-clavulanate 875-125 MG tablet Commonly known as: AUGMENTIN Stopped by: Dorita Sciara, MD   Dexcom G6 Receiver Devi Stopped by: Dorita Sciara, MD   Dexcom G6 Sensor Misc Stopped by: Dorita Sciara, MD   Dexcom G6 Transmitter Misc Stopped by: Dorita Sciara, MD     TAKE these medications   atorvastatin 20 MG tablet Commonly known as: LIPITOR TAKE 1 TABLET (20 MG TOTAL) BY MOUTH DAILY.   cyclobenzaprine 5 MG tablet Commonly known as: FLEXERIL Take  1 tablet (5 mg total) by mouth at bedtime.   Farxiga 10 MG Tabs tablet Generic drug: dapagliflozin propanediol Take 10 mg by mouth daily.   ibuprofen 200 MG tablet Commonly known as: ADVIL Take 200 mg by mouth every 6 (six) hours as needed for moderate pain.   lisinopril 5 MG tablet Commonly known as: ZESTRIL Take 1 tablet (5 mg total) by mouth daily.   meloxicam 7.5 MG tablet Commonly known as: MOBIC Take 1 tablet (7.5 mg total) by mouth daily.   metFORMIN 1000 MG tablet Commonly known as: GLUCOPHAGE Take 1 tablet (1,000 mg total) by  mouth daily with breakfast. What changed: See the new instructions. Changed by: Dorita Sciara, MD   onetouch ultrasoft lancets Use as instructed to test blood sugar 2 times daily E11.65 What changed: additional instructions Changed by: Jeannetta Nap, CMA   OneTouch Verio test strip Generic drug: glucose blood Use as instructed to test blood sugar 2 times daily E11.65 What changed: additional instructions Changed by: Dorita Sciara, MD   OneTouch Verio w/Device Kit 1 kit by Does not apply route daily. Replaces: Accu-Chek Aviva device Started by: Dorita Sciara, MD   Trulicity 1.5 TI/1.4ER Sopn Generic drug: Dulaglutide Inject 1.5 mg into the skin once a week.   Vitamin D (Ergocalciferol) 1.25 MG (50000 UNIT) Caps capsule Commonly known as: DRISDOL Take 1 capsule (50,000 Units total) by mouth every 7 (seven) days.        OBJECTIVE:   Vital Signs: BP (!) 144/72 (BP Location: Left Arm, Patient Position: Sitting, Cuff Size: Large)   Pulse 95   Temp 98.4 F (36.9 C)   Ht 6' 1"  (1.854 m)   Wt (!) 321 lb (145.6 kg)   SpO2 95%   BMI 42.35 kg/m   Wt Readings from Last 3 Encounters:  07/31/19 (!) 321 lb (145.6 kg)  08/15/18 (!) 322 lb 9.6 oz (146.3 kg)  07/25/18 (!) 321 lb (145.6 kg)     Exam: General: Pt appears well and is in NAD  HEENT: Eyes: External eye exam normal without stare, lid lag or exophthalmos.  EOM intact.    Neck: General: Supple without adenopathy. Thyroid: Thyroid size normal.  No goiter or nodules appreciated. No thyroid bruit.  Chest wall:  No evidence of gynecomastia  Lungs: Clear with good BS bilat with no rales, rhonchi, or wheezes  Heart: RRR with normal S1 and S2 and no gallops; no murmurs; no rub  Abdomen: Normoactive bowel sounds, soft, nontender, without masses or organomegaly palpable Declined genital exam  Extremities: No pretibial edema. No tremor.   Neuro: MS is good with appropriate affect, pt is alert and Ox3     DM foot exam:07/31/2019 The skin of the feet is intact without sores or ulcerations. The pedal pulses are 2+ on right and 2+ on left. The sensation is intact to a screening 5.07, 10 gram monofilament bilaterally            DATA REVIEWED:  Lab Results  Component Value Date   HGBA1C 8.1 (A) 07/31/2019   HGBA1C 8.7 (A) 06/04/2018   HGBA1C 8.6 (A) 12/24/2017   Lab Results  Component Value Date   MICROALBUR 0.6 08/27/2016   LDLCALC 32 06/04/2018   CREATININE 0.77 06/04/2018   Lab Results  Component Value Date   MICRALBCREAT 3.0 09/09/2017    Results for Tim Allen, Tim Allen (MRN [id number]) as of 08/03/2019 12:51  Ref. Range 07/31/2019 14:18  Sodium Latest Ref Range:  135 - 145 mEq/L 136  Potassium Latest Ref Range: 3.5 - 5.1 mEq/L 4.1  Chloride Latest Ref Range: 96 - 112 mEq/L 101  CO2 Latest Ref Range: 19 - 32 mEq/L 28  Glucose Latest Ref Range: 70 - 99 mg/dL 244 (H)  BUN Latest Ref Range: 6 - 23 mg/dL 9  Creatinine Latest Ref Range: 0.40 - 1.50 mg/dL 0.86  Calcium Latest Ref Range: 8.4 - 10.5 mg/dL 9.9  GFR Latest Ref Range: >60.00 mL/min 116.97           ASSESSMENT / PLAN / RECOMMENDATIONS:   1) Type 2 diabetes Mellitus, Poorly controlled, With neuropathic complications - Most recent A1c of 8.1%. Goal A1c <7.0 %.    -Despite the lack of Trulicity intake due to loss of health insurance, and despite continued dietary indiscretions, his A1c is much better than it has been in the past. -Patient does not like the taste of sweetener, hence he would not try any diet drinks -We again discussed the risk of hypoglycemia and causing renal, vision, and neuropathic damages -He has been tolerating regular release Metformin 1000 mg daily, he is unable to tolerate twice daily dosing.  When he was on the Exar dosing he was only able to tolerate 3 tabs daily -He is also tolerating [place] without side effects, BMP is normal   MEDICATIONS: -Continue metformin 1000 mg daily  -Restart  Trulicity  1.5 mg daily -Continue Farxiga 10 mg daily   EDUCATION / INSTRUCTIONS:  BG monitoring instructions: Patient is instructed to check his blood sugars 2 times a day, fasting and bedtime.  Call [facility] if: BG persistently < 70 or > 300. . I reviewed the Rule of 15 for the treatment of hypoglycemia in detail with the patient. Literature supplied.   F/U in 4 months   Signed electronically by: Mack Guise, MD  [facility]  [facility] [address] [place], [place] [address] Phone: [phone] FAX: [phone]   CC: Girtha Rm, NP-C [address] [address] Phone: [phone]  Fax: [phone]  Return to Endocrinology clinic as below: Future Appointments  Date Time Provider [place]  11/30/2019  9:30 AM Shamleffer, Melanie Crazier, MD [facility] None

## 2019-07-31 NOTE — Telephone Encounter (Signed)
Spoke to pt wife and she stated that she understood explanation also sent rx for covered meter to pharmacy

## 2019-07-31 NOTE — Telephone Encounter (Signed)
Patient's wife called wanting to see if Dr Kelton Pillar can call in a RX of Metformin XR so that he would only have to take it once daily.  She would also like a new glucose meter sent to the pharmacy.

## 2019-08-03 ENCOUNTER — Encounter: Payer: Self-pay | Admitting: Internal Medicine

## 2019-08-03 ENCOUNTER — Telehealth: Payer: Self-pay | Admitting: Internal Medicine

## 2019-08-03 NOTE — Telephone Encounter (Signed)
Tim Allen with ph# [phone] with [facility] - [place], [place] - [address] called re: PA that was sent to Dr. Kelton Pillar on 07/31/19 for Farxiga.Tim Allen requests at the ph# listed above to make sure the above fax was received (CoverMyMeds).

## 2019-08-03 NOTE — Telephone Encounter (Signed)
Spoke to [place] and per previous phone note from 07/31/2019 Dr. Kelton Pillar wants pt to stay on regular Metfomin, also informed Marnette Burgess that PA for farxiga has been started on Cover my meds also per previous phone note.

## 2019-08-03 NOTE — Telephone Encounter (Signed)
Fiona with [facility] [address]   Ph# [phone] called re: RX for Metformin. Patient was prescribed Metformin however patient told Marnette Burgess at the above [facility] that he wants the extended release Metform. Marnette Burgess requests to be called at the above ph# or fax RX to Fx# [phone]

## 2019-08-04 MED ORDER — JARDIANCE 25 MG PO TABS: 25.0000 mg | ORAL_TABLET | Freq: Every day | ORAL | 11 refills | Status: DC

## 2019-08-04 NOTE — Addendum Note (Signed)
Addended by: Dorita Sciara on: 08/04/2019 11:04 AM   Modules accepted: Orders

## 2019-08-04 NOTE — Telephone Encounter (Signed)
Switched it to [employer] 25 mg daily     Abby Nena Jordan, MD  [facility]ology  [facility] [address] [place], [place] [address] Phone: [phone] FAX: [phone]

## 2019-08-04 NOTE — Telephone Encounter (Signed)
Spoke to Tim Allen and informed her to d/c farxiga

## 2019-08-17 ENCOUNTER — Other Ambulatory Visit: Payer: Self-pay | Admitting: Family Medicine

## 2019-08-17 DIAGNOSIS — E119 Type 2 diabetes mellitus without complications: Secondary | ICD-10-CM

## 2019-08-17 NOTE — Telephone Encounter (Signed)
Pt was advised that he needed an appt but he will get his wife to ccall in to schedule. Advised him I would give 30 days

## 2019-09-16 ENCOUNTER — Other Ambulatory Visit: Payer: Self-pay | Admitting: Family Medicine

## 2019-09-16 DIAGNOSIS — E119 Type 2 diabetes mellitus without complications: Secondary | ICD-10-CM

## 2019-09-16 NOTE — Telephone Encounter (Signed)
Pt has an appt 4/12

## 2019-09-29 ENCOUNTER — Encounter: Payer: Self-pay | Admitting: Emergency Medicine

## 2019-09-29 ENCOUNTER — Ambulatory Visit
Admission: EM | Admit: 2019-09-29 | Discharge: 2019-09-29 | Disposition: A | Discharge status: Home / Self Care | Payer: 59 | Attending: Emergency Medicine | Admitting: Emergency Medicine

## 2019-09-29 ENCOUNTER — Other Ambulatory Visit: Payer: Self-pay

## 2019-09-29 DIAGNOSIS — M545 Low back pain, unspecified: Secondary | ICD-10-CM

## 2019-09-29 MED ORDER — IBUPROFEN 800 MG PO TABS: 800.0000 mg | ORAL_TABLET | Freq: Three times a day (TID) | ORAL | 0 refills | Status: AC

## 2019-09-29 NOTE — ED Triage Notes (Signed)
Pt sts lower back pain x 2 days worse with movement; denies obvious injury

## 2019-09-29 NOTE — Discharge Instructions (Addendum)
Recommend RICE: rest, ice, compression, elevation as needed for pain.    Heat therapy (hot compress, warm wash rag, hot showers, etc.) can help relax muscles and soothe muscle aches. Cold therapy (ice packs) can be used to help swelling both after injury and after prolonged use of areas of chronic pain/aches.  For pain: recommend 350 mg-1000 mg of Tylenol (acetaminophen) and/or 200 mg - 800 mg of Advil (ibuprofen, Motrin) every 8 hours as needed.  May alternate between the two throughout the day as they are generally safe to take together.  DO NOT exceed more than 3000 mg of Tylenol or 3200 mg of ibuprofen in a 24 hour period as this could damage your stomach, kidneys, liver, or increase your bleeding risk.  May take muscle relaxer as needed for severe pain / spasm.  (This medication may cause you to become tired so it is important you do not drink alcohol or operate heavy machinery while on this medication.  Recommend your first dose to be taken before bedtime to monitor for side effects safely)  Return for

## 2019-09-29 NOTE — ED Provider Notes (Signed)
[name]    CSN: [id number] Arrival date & time: 09/29/19  1117      History   Chief Complaint Chief Complaint  Patient presents with  . Back Pain    HPI Tim Allen is a 44 y.o. male with history of obesity, sleep apnea, type 2 diabetes, tobacco use presenting for lower back pain since 2:30 AM today.  States is worse with certain movements.  States pain goes across entire low back, not worse on either side.  Denies radiating down legs.  Describes pain as dull.  States he went to sleep after first noticing pain, though when he woke up from sleeping on his side it became throbbing.  Denies injury, fall.  No saddle or anesthesia, lower extremity numbness, weakness, urinary retention or fecal incontinence.  Patient denying fever, malaise.  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    Past Medical History:  Diagnosis Date  . Heavy cigarette smoker   . Morbid obesity ([place])   . Sleep apnea   . Type 2 diabetes mellitus ([place])    new diagnosis with HgB A1c 13.9% on 08/27/16  . Vitamin D deficiency 06/16/2018    Patient Active Problem List   Diagnosis Date Noted  . Low testosterone in male 08/17/2018  . Hypogonadism in male 07/25/2018  . Vitamin D deficiency 06/16/2018  . Anhedonia 06/04/2018  . Fatigue 06/04/2018  . Uncontrolled type 2 diabetes mellitus with hyperglycemia ([place]) 06/04/2018  . Decreased libido 06/04/2018  . ASCVD (arteriosclerotic cardiovascular disease) 08/28/2016  . Type 2 diabetes mellitus with diabetic polyneuropathy, without long-term current use of insulin ([place]) 08/27/2016  . Morbid obesity ([place]) 08/27/2016  . Heavy smoker 08/27/2016  . OSA on CPAP 09/28/2014  . BMI 45.0-49.9, adult ([place]) 09/28/2014  . Hyperglycemia 09/28/2014    Past Surgical History:  Procedure Laterality Date  . KNEE SURGERY Right age 21   reconstruction after traumatic injury       Home Medications    Prior to Admission  medications   Medication Sig Start Date End Date Taking? Authorizing Provider  atorvastatin (LIPITOR) 20 MG tablet TAKE 1 TABLET (20 MG TOTAL) BY MOUTH DAILY.  08/17/19   Henson, [name] L, NP-C  Blood Glucose Monitoring Suppl (ONETOUCH VERIO) w/Device KIT 1 kit by Does not apply route daily. 07/31/19   Shamleffer, Melanie Crazier, MD  Dulaglutide (TRULICITY) 1.5 BO/1.7PZ SOPN Inject 1.5 mg into the skin once a week. 07/31/19   Shamleffer, Melanie Crazier, MD  empagliflozin (JARDIANCE) 25 MG TABS tablet Take 25 mg by mouth daily before breakfast. 08/04/19   Shamleffer, Melanie Crazier, MD  glucose blood (ONETOUCH VERIO) test strip Use as instructed to test blood sugar 2 times daily E11.65 07/31/19   Shamleffer, Melanie Crazier, MD  ibuprofen (ADVIL) 800 MG tablet Take 1 tablet (800 mg total) by mouth 3 (three) times daily. 09/29/19   Hall-Potvin, [place], PA-C  Lancets (ONETOUCH ULTRASOFT) lancets Use as instructed to test blood sugar 2 times daily E11.65 07/31/19   Shamleffer, Melanie Crazier, MD  lisinopril (ZESTRIL) 5 MG tablet TAKE 1 TABLET (5 MG TOTAL) BY MOUTH DAILY.  09/16/19   Henson, [name] L, NP-C  metFORMIN (GLUCOPHAGE) 1000 MG tablet Take 1 tablet (1,000 mg total) by mouth daily with breakfast. 07/31/19   Shamleffer, Melanie Crazier, MD  Vitamin D, Ergocalciferol, (DRISDOL) 1.25 MG (50000 UT) CAPS capsule Take 1 capsule (50,000 Units total) by mouth every 7 (seven) days.  02/02/19   Shamleffer, Melanie Crazier, MD    Family History Family History  Problem Relation Age of Onset  . Diabetes Father     Social History Social History   Tobacco Use  . Smoking status: Current Every Day Smoker    Packs/day: 2.00    Years: 10.00    Pack years: 20.00  . Smokeless tobacco: Never Used  Substance Use Topics  . Alcohol use: No    Alcohol/week: 0.0 standard drinks  . Drug use: No     Allergies   Patient has no known allergies.   Review of Systems As per HPI   Physical Exam Triage Vital  Signs ED Triage Vitals  Enc Vitals Group     BP      Pulse      Resp      Temp      Temp src      SpO2      Weight      Height      Head Circumference      Peak Flow      Pain Score      Pain Loc      Pain Edu?      Excl. in [place]?    No data found.  Updated Vital Signs BP 131/87 (BP Location: Left Arm)   Pulse 97   Temp 98.1 F (36.7 C) (Oral)   Resp 20   SpO2 95%   Visual Acuity Right Eye Distance:   Left Eye Distance:   Bilateral Distance:    Right Eye Near:   Left Eye Near:    Bilateral Near:     Physical Exam Constitutional:      General: He is not in acute distress. HENT:     Head: Normocephalic and atraumatic.  Eyes:     General: No scleral icterus.    Pupils: Pupils are equal, round, and reactive to light.  Cardiovascular:     Rate and Rhythm: Normal rate.  Pulmonary:     Effort: Pulmonary effort is normal. No respiratory distress.     Breath sounds: No wheezing.  Musculoskeletal:        General: Tenderness present. No swelling or deformity. Normal range of motion.     Right lower leg: No edema.     Left lower leg: No edema.     Comments: Diffuse lumbar tenderness (R>L) without edema, crepitus, ecchymosis.  No PSIS tenderness.  No spinous process deformity, crepitus.  Skin:    Coloration: Skin is not jaundiced or pale.  Neurological:     Mental Status: He is alert and oriented to person, place, and time.      UC Treatments / Results  Labs (all labs ordered are listed, but only abnormal results are displayed) Labs Reviewed - No data to display  EKG   Radiology No results found.  Procedures Procedures (including critical care time)  Medications Ordered in UC Medications - No data to display  Initial Impression / Assessment and Plan / UC Course  I have reviewed the triage vital signs and the nursing notes.  Pertinent labs & imaging results that were available during my care of the patient were reviewed by me and considered in my  medical decision making (see chart for details).     Patient afebrile, nontoxic, and without inciting event/injury.  Likely due to prolonged sitting/sedentary lifestyle.  Provided stretches and encouraged patient to take more frequent breaks throughout the day.  Will trial ibuprofen, rice,  patient follow-up with PCP.  Return precautions discussed, patient verbalized understanding and is agreeable to plan. Final Clinical Impressions(s) / UC Diagnoses   Final diagnoses:  Acute bilateral low back pain without sciatica     Discharge Instructions     Recommend RICE: rest, ice, compression, elevation as needed for pain.    Heat therapy (hot compress, warm wash rag, hot showers, etc.) can help relax muscles and soothe muscle aches. Cold therapy (ice packs) can be used to help swelling both after injury and after prolonged use of areas of chronic pain/aches.  For pain: recommend 350 mg-1000 mg of Tylenol (acetaminophen) and/or 200 mg - 800 mg of Advil (ibuprofen, Motrin) every 8 hours as needed.  May alternate between the two throughout the day as they are generally safe to take together.  DO NOT exceed more than 3000 mg of Tylenol or 3200 mg of ibuprofen in a 24 hour period as this could damage your stomach, kidneys, liver, or increase your bleeding risk.  May take muscle relaxer as needed for severe pain / spasm.  (This medication may cause you to become tired so it is important you do not drink alcohol or operate heavy machinery while on this medication.  Recommend your first dose to be taken before bedtime to monitor for side effects safely)  Return for     ED Prescriptions    Medication Sig Dispense Auth. Provider   ibuprofen (ADVIL) 800 MG tablet Take 1 tablet (800 mg total) by mouth 3 (three) times daily. 21 tablet Hall-Potvin, [place], PA-C     PDMP not reviewed this encounter.   Hall-Potvin, [place], [place] 09/29/19 1331

## 2019-10-04 NOTE — Patient Instructions (Addendum)
Pay close attention to the palpitations and if they are becoming more bothersome or if you have any chest pain or shortness of breath you would need to get this checked right away.   I am increasing your lisinopril from 5 mg to 10 mg to see if we can get your blood pressure under better control.  Watch your sodium intake.   Follow up with me in 4-6 weeks for HTN.     Preventive Care 44-44 Years Old, Male Preventive care refers to lifestyle choices and visits with your health care provider that can promote health and wellness. This includes:  A yearly physical exam. This is also called an annual well check.  Regular dental and eye exams.  Immunizations.  Screening for certain conditions.  Healthy lifestyle choices, such as eating a healthy diet, getting regular exercise, not using drugs or products that contain nicotine and tobacco, and limiting alcohol use. What can I expect for my preventive care visit? Physical exam Your health care provider will check:  Height and weight. These may be used to calculate body mass index (BMI), which is a measurement that tells if you are at a healthy weight.  Heart rate and blood pressure.  Your skin for abnormal spots. Counseling Your health care provider may ask you questions about:  Alcohol, tobacco, and drug use.  Emotional well-being.  Home and relationship well-being.  Sexual activity.  Eating habits.  Work and work Statistician. What immunizations do I need?  Influenza (flu) vaccine  This is recommended every year. Tetanus, diphtheria, and pertussis (Tdap) vaccine  You may need a Td booster every 10 years. Varicella (chickenpox) vaccine  You may need this vaccine if you have not already been vaccinated. Zoster (shingles) vaccine  You may need this after age 79. Measles, mumps, and rubella (MMR) vaccine  You may need at least one dose of MMR if you were born in 1957 or later. You may also need a second  dose. Pneumococcal conjugate (PCV13) vaccine  You may need this if you have certain conditions and were not previously vaccinated. Pneumococcal polysaccharide (PPSV23) vaccine  You may need one or two doses if you smoke cigarettes or if you have certain conditions. Meningococcal conjugate (MenACWY) vaccine  You may need this if you have certain conditions. Hepatitis A vaccine  You may need this if you have certain conditions or if you travel or work in places where you may be exposed to hepatitis A. Hepatitis B vaccine  You may need this if you have certain conditions or if you travel or work in places where you may be exposed to hepatitis B. Haemophilus influenzae type b (Hib) vaccine  You may need this if you have certain risk factors. Human papillomavirus (HPV) vaccine  If recommended by your health care provider, you may need three doses over 6 months. You may receive vaccines as individual doses or as more than one vaccine together in one shot (combination vaccines). Talk with your health care provider about the risks and benefits of combination vaccines. What tests do I need? Blood tests  Lipid and cholesterol levels. These may be checked every 5 years, or more frequently if you are over 44 years old.  Hepatitis C test.  Hepatitis B test. Screening  Lung cancer screening. You may have this screening every year starting at age 44 if you have a 30-pack-year history of smoking and currently smoke or have quit within the past 15 years.  Prostate cancer screening. Recommendations will  vary depending on your family history and other risks.  Colorectal cancer screening. All adults should have this screening starting at age 44 and continuing until age 68. Your health care provider may recommend screening at age 44 if you are at increased risk. You will have tests every 1-10 years, depending on your results and the type of screening test.  Diabetes screening. This is done by  checking your blood sugar (glucose) after you have not eaten for a while (fasting). You may have this done every 1-3 years.  Sexually transmitted disease (STD) testing. Follow these instructions at home: Eating and drinking  Eat a diet that includes fresh fruits and vegetables, whole grains, lean protein, and low-fat dairy products.  Take vitamin and mineral supplements as recommended by your health care provider.  Do not drink alcohol if your health care provider tells you not to drink.  If you drink alcohol: ? Limit how much you have to 0-2 drinks a day. ? Be aware of how much alcohol is in your drink. In the [place] one drink equals one 12 oz bottle of beer (355 mL), one 5 oz glass of wine (148 mL), or one 1 oz glass of hard liquor (44 mL). Lifestyle  Take daily care of your teeth and gums.  Stay active. Exercise for at least 30 minutes on 5 or more days each week.  Do not use any products that contain nicotine or tobacco, such as cigarettes, e-cigarettes, and chewing tobacco. If you need help quitting, ask your health care provider.  If you are sexually active, practice safe sex. Use a condom or other form of protection to prevent STIs (sexually transmitted infections).  Talk with your health care provider about taking a low-dose aspirin every day starting at age 44. What's next?  Go to your health care provider once a year for a well check visit.  Ask your health care provider how often you should have your eyes and teeth checked.  Stay up to date on all vaccines. This information is not intended to replace advice given to you by your health care provider. Make sure you discuss any questions you have with your health care provider. Document Revised: 06/05/2018 Document Reviewed: 06/05/2018 Elsevier Patient Education  2020 [employer].

## 2019-10-04 NOTE — Progress Notes (Signed)
Subjective:    Patient ID: Tim Allen, male    DOB: [birth year], 44 y.o.   MRN: [id number]  HPI Chief Complaint  Patient presents with  . fasting cpe    fasting cpe   He is here for a complete physical exam and to follow up on chronic health conditions. Last CPE: 2019  Other providers: Endocrinologist- Dr. Kelton Pillar   States he occasionally has palpitations. Feels a fluttering sensation for a few seconds when he is laying down. No chest pain, shortness of breath, orthopnea, LE edema.   Diabetes- managed by Dr. Kelton Pillar  Overdue for diabetic eye exam.   HTN- BP has been elevated recently. He is only taking lisinopril 5 mg. Does not check his BP at home. Diet fairly high in sodium.   OSA on CPAP- using CPAP nightly and doing well with it.   Continue having fatigue.  Hx of low testosterone. States he has been using the gel at times. Not consistent.  Needs level rechecked.   Reports having issues with maintaining an erection. Libido is good.   Reports taking statin daily.   Hx of vitamin D deficiency.   Smoking- 1 1/2 pack per day still   Pneumonia vaccine- declines   Social history: Lives with wife and kids, works as a Administrator, long hours and trips  No drug use Drinking alcohol 1-2 days per week  Diet: packing healthy foods for his road trips. Eating fast food still  Exercise: nothing regular   Immunizations: declines pneumonia vaccine   Health maintenance:  Colonoscopy: never  Last PSA: never  Last Dental Exam: 2 years ago  Last Eye Exam: visual acuity in December 2020   Wears seatbelt always, smoke detectors in home and functioning, does not text while driving, feels safe in home environment.  Reviewed allergies, medications, past medical, surgical, family, and social history.     Review of Systems Review of Systems Constitutional: -fever, -chills, -sweats, -unexpected weight change,+fatigue ENT: -runny nose, -ear pain, -sore  throat Cardiology:  -chest pain, -palpitations, -edema Respiratory: -cough, -shortness of breath, -wheezing Gastroenterology: -abdominal pain, -nausea, -vomiting, -diarrhea, -constipation  Hematology: -bleeding or bruising problems Musculoskeletal: -arthralgias, -myalgias, -joint swelling, -back pain Ophthalmology: -vision changes Urology: -dysuria, -difficulty urinating, -hematuria, -urinary frequency, -urgency Neurology: -headache, -weakness, -tingling, -numbness       Objective:   Physical Exam BP 140/82   Pulse 98   Temp 98 F (36.7 C)   Ht 6' 0.5" (1.842 m)   Wt (!) 322 lb 3.2 oz (146.1 kg)   BMI 43.10 kg/m   General Appearance:    Alert, cooperative, no distress, appears stated age  Head:    Normocephalic, without obvious abnormality, atraumatic  Eyes:    PERRL, conjunctiva/corneas clear, EOM's intact  Ears:    Normal TM's and external ear canals  Nose:   Mask in place   Throat:   Mask in place   Neck:   Supple, no lymphadenopathy;  thyroid:  no   enlargement/tenderness/nodules; no JVD  Back:    Spine nontender, no curvature, ROM normal, no CVA     tenderness  Lungs:     Clear to auscultation bilaterally without wheezes, rales or     ronchi; respirations unlabored  Chest Wall:    No tenderness or deformity   Heart:    Regular rate and rhythm, S1 and S2 normal, no murmur, rub   or gallop  Breast Exam:    No chest wall tenderness, masses or  gynecomastia  Abdomen:     Soft, non-tender, nondistended, normoactive bowel sounds,    no masses, no hepatosplenomegaly  Genitalia:    Declines      Extremities:   No clubbing, cyanosis or edema  Pulses:   2+ and symmetric all extremities  Skin:   Skin color, texture, turgor normal, no rashes or lesions  Lymph nodes:   Cervical, supraclavicular, and axillary nodes normal  Neurologic:   CNII-XII intact, normal strength, sensation and gait          Psych:   Normal mood, affect, hygiene and grooming.        Assessment & Plan:   Routine general medical examination at a health care facility - Plan: CBC with Differential/Platelet, Comprehensive metabolic panel, TSH, T4, free, Lipid panel, T3 -Here today for fasting CPE.  Discussed preventive healthcare.  Discussed that he will be due for colonoscopy next year.  We also discussed PSA blood test and that we will start this next year as well.  Discussed immunizations, safety and healthy lifestyle including diet, exercise, sedentary lifestyle, etc.  OSA on CPAP -Reports using his CPAP nightly and doing well with it.  I recommend he continue on it.  ASCVD (arteriosclerotic cardiovascular disease) - Plan: Lipid panel -Continue statin therapy.  He seems to be doing well with this.  Check lipids and follow-up  Heavy smoker -Encouraged him to cut back.  Discussed that smoking is a contributor for worsening health  Morbid obesity ([place]) - Plan: TSH, T4, free, Lipid panel -Counseling on healthy diet and exercise.  Does have a sedentary job.  Vitamin D deficiency - Plan: VITAMIN D 25 Hydroxy (Vit-D Deficiency, Fractures) -Check vitamin D level and follow-up.  Hypogonadism in male - Plan: Testosterone -Reports he uses the testosterone gel occasionally.  No follow-up since.  Check testosterone level  Erectile dysfunction, unspecified erectile dysfunction type -Discussed multiple risk factors affecting sexual health including hypertension, diabetes, smoking, obesity.  We discussed the possibility of trying Viagra or Cialis but will hold off for now.  Intermittent palpitations - Plan: EKG 12-Lead -EKG shows NSR, normal rate, unremarkable.  Read by myself and Dr. Redmond School. Recommend he continue to monitor and follow-up if worsening  Fatigue, unspecified type - Plan: CBC with Differential/Platelet, Comprehensive metabolic panel, TSH, T4, free, T3 -Discussed multiple etiologies for fatigue.  Check labs and follow-up  Uncontrolled type 2 diabetes mellitus with hyperglycemia ([place]) -  Plan: Comprehensive metabolic panel, Lipid panel -Managed by endocrinology  Immunization counseling -Declines pneumonia vaccine  Hypertension associated with diabetes ([place]) - Plan: lisinopril (ZESTRIL) 10 MG tablet -Blood pressure is not in goal range.  We will increase his lisinopril to 10 mg daily.  Encouraged him to check his blood pressure outside of here.  Encouraged low-sodium diet.

## 2019-10-05 ENCOUNTER — Ambulatory Visit (INDEPENDENT_AMBULATORY_CARE_PROVIDER_SITE_OTHER): Payer: 59 | Admitting: Family Medicine

## 2019-10-05 ENCOUNTER — Encounter: Payer: Self-pay | Admitting: Family Medicine

## 2019-10-05 ENCOUNTER — Other Ambulatory Visit: Payer: Self-pay

## 2019-10-05 VITALS — BP 140/82 | HR 98 | Temp 98.0°F | Ht 72.5 in | Wt 322.2 lb

## 2019-10-05 DIAGNOSIS — I152 Hypertension secondary to endocrine disorders: Secondary | ICD-10-CM

## 2019-10-05 DIAGNOSIS — Z7189 Other specified counseling: Secondary | ICD-10-CM | POA: Diagnosis not present

## 2019-10-05 DIAGNOSIS — F17200 Nicotine dependence, unspecified, uncomplicated: Secondary | ICD-10-CM

## 2019-10-05 DIAGNOSIS — G4733 Obstructive sleep apnea (adult) (pediatric): Secondary | ICD-10-CM

## 2019-10-05 DIAGNOSIS — E1159 Type 2 diabetes mellitus with other circulatory complications: Secondary | ICD-10-CM | POA: Diagnosis not present

## 2019-10-05 DIAGNOSIS — Z9989 Dependence on other enabling machines and devices: Secondary | ICD-10-CM

## 2019-10-05 DIAGNOSIS — R5383 Other fatigue: Secondary | ICD-10-CM

## 2019-10-05 DIAGNOSIS — I2510 Atherosclerotic heart disease of native coronary artery without angina pectoris: Secondary | ICD-10-CM | POA: Diagnosis not present

## 2019-10-05 DIAGNOSIS — Z7185 Encounter for immunization safety counseling: Secondary | ICD-10-CM

## 2019-10-05 DIAGNOSIS — E559 Vitamin D deficiency, unspecified: Secondary | ICD-10-CM

## 2019-10-05 DIAGNOSIS — N529 Male erectile dysfunction, unspecified: Secondary | ICD-10-CM

## 2019-10-05 DIAGNOSIS — R002 Palpitations: Secondary | ICD-10-CM

## 2019-10-05 DIAGNOSIS — I10 Essential (primary) hypertension: Secondary | ICD-10-CM | POA: Diagnosis not present

## 2019-10-05 DIAGNOSIS — E291 Testicular hypofunction: Secondary | ICD-10-CM

## 2019-10-05 DIAGNOSIS — E1165 Type 2 diabetes mellitus with hyperglycemia: Secondary | ICD-10-CM

## 2019-10-05 DIAGNOSIS — Z0000 Encounter for general adult medical examination without abnormal findings: Secondary | ICD-10-CM

## 2019-10-05 HISTORY — DX: Palpitations: R00.2

## 2019-10-05 LAB — LIPID PANEL

## 2019-10-05 MED ORDER — LISINOPRIL 10 MG PO TABS: 10.0000 mg | ORAL_TABLET | Freq: Every day | ORAL | 1 refills | Status: DC

## 2019-10-06 LAB — CBC WITH DIFFERENTIAL/PLATELET
Basophils Absolute: 0.1 10*3/uL (ref 0.0–0.2)
Basos: 1 %
EOS (ABSOLUTE): 0.1 10*3/uL (ref 0.0–0.4)
Eos: 1 %
Hematocrit: 50.9 % (ref 37.5–51.0)
Hemoglobin: 16.8 g/dL (ref 13.0–17.7)
Immature Grans (Abs): 0 10*3/uL (ref 0.0–0.1)
Immature Granulocytes: 0 %
Lymphocytes Absolute: 3.4 10*3/uL — ABNORMAL HIGH (ref 0.7–3.1)
Lymphs: 37 %
MCH: 29.4 pg (ref 26.6–33.0)
MCHC: 33 g/dL (ref 31.5–35.7)
MCV: 89 fL (ref 79–97)
Monocytes Absolute: 0.9 10*3/uL (ref 0.1–0.9)
Monocytes: 10 %
Neutrophils Absolute: 4.7 10*3/uL (ref 1.4–7.0)
Neutrophils: 51 %
Platelets: 238 10*3/uL (ref 150–450)
RBC: 5.71 x10E6/uL (ref 4.14–5.80)
RDW: 13.9 % (ref 11.6–15.4)
WBC: 9.3 10*3/uL (ref 3.4–10.8)

## 2019-10-06 LAB — COMPREHENSIVE METABOLIC PANEL
ALT: 25 IU/L (ref 0–44)
AST: 17 IU/L (ref 0–40)
Albumin/Globulin Ratio: 1.6 (ref 1.2–2.2)
Albumin: 4.4 g/dL (ref 4.0–5.0)
Alkaline Phosphatase: 74 IU/L (ref 39–117)
BUN/Creatinine Ratio: 12 (ref 9–20)
BUN: 12 mg/dL (ref 6–24)
Bilirubin Total: <0.2 mg/dL (ref 0.0–1.2)
CO2: 21 mmol/L (ref 20–29)
Calcium: 10 mg/dL (ref 8.7–10.2)
Chloride: 100 mmol/L (ref 96–106)
Creatinine, Ser: 0.97 mg/dL (ref 0.76–1.27)
GFR calc Af Amer: 109 mL/min/{1.73_m2} (ref >59)
GFR calc non Af Amer: 95 mL/min/{1.73_m2} (ref >59)
Globulin, Total: 2.8 g/dL (ref 1.5–4.5)
Glucose: 191 mg/dL — ABNORMAL HIGH (ref 65–99)
Potassium: 4.3 mmol/L (ref 3.5–5.2)
Sodium: 140 mmol/L (ref 134–144)
Total Protein: 7.2 g/dL (ref 6.0–8.5)

## 2019-10-06 LAB — TSH: TSH: 2.01 u[IU]/mL (ref 0.450–4.500)

## 2019-10-06 LAB — LIPID PANEL
Chol/HDL Ratio: 3.6 ratio (ref 0.0–5.0)
Cholesterol, Total: 111 mg/dL (ref 100–199)
HDL: 31 mg/dL — ABNORMAL LOW (ref >39)
LDL Chol Calc (NIH): 53 mg/dL (ref 0–99)
Triglycerides: 156 mg/dL — ABNORMAL HIGH (ref 0–149)
VLDL Cholesterol Cal: 27 mg/dL (ref 5–40)

## 2019-10-06 LAB — T3: T3, Total: 169 ng/dL (ref 71–180)

## 2019-10-06 LAB — T4, FREE: Free T4: 1.01 ng/dL (ref 0.82–1.77)

## 2019-10-06 LAB — VITAMIN D 25 HYDROXY (VIT D DEFICIENCY, FRACTURES): Vit D, 25-Hydroxy: 22.6 ng/mL — ABNORMAL LOW (ref 30.0–100.0)

## 2019-10-06 LAB — TESTOSTERONE: Testosterone: 170 ng/dL — ABNORMAL LOW (ref 264–916)

## 2019-10-09 ENCOUNTER — Encounter: Payer: Self-pay | Admitting: Internal Medicine

## 2019-10-27 ENCOUNTER — Other Ambulatory Visit: Payer: Self-pay | Admitting: Internal Medicine

## 2019-10-27 ENCOUNTER — Other Ambulatory Visit: Payer: Self-pay | Admitting: Family Medicine

## 2019-10-27 DIAGNOSIS — E559 Vitamin D deficiency, unspecified: Secondary | ICD-10-CM

## 2019-11-15 NOTE — Progress Notes (Signed)
   Subjective:    Patient ID: Tim Allen, male    DOB: 04-29-1976, 44 y.o.   MRN: [id number]  HPI Chief Complaint  Patient presents with  . other    f/u on HTN    Here to follow up on elevated BP. HTN is a fairly new diagnosis for him. He was taking lisinopril 5 mg due to diabetes. At his last few visits he has had elevated blood pressures so we increased his lisinopril to 10 mg last month and encouraged a low sodium diet. 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 He is sedentary.  Heavy smoker and is not ready to stop.   OSA and states he is using a CPAP nightly. He is doing well with this.   States he sporadically has palpitations which last only a couple of seconds and resolves spontaneously. Denies chest pain, shortness of breath, orthopnea, LE edema.  He continues to deal with fatigue. Has a visit next month with his endocrinologist to discuss diabetes and low testosterone.  Hx of vitamin D deficiency.  States he is taking vitamin D once weekly.   States his wife was recently told she has another lump in her breast and has a history of breast cancer. He is quite concerned about her.   Reviewed allergies, medications, past medical, surgical, family, and social history.    Review of Systems Pertinent positives and negatives in the history of present illness.     Objective:   Physical Exam BP 136/84   Pulse 84   Temp 97.8 F (36.6 C)   Wt (!) 325 lb 12.8 oz (147.8 kg)   BMI 43.58 kg/m   Alert and in no distress.  Cardiac exam shows a regular sinus rhythm without murmurs or gallops. Lungs are clear to auscultation. Extremities without edema.       Assessment & Plan:  Hypertension associated with diabetes ([place]) -counseling on HTN as an increased risk factor for heart disease. Encouraged him to start checking his BP outside of here and report back his readings. Discussed low sodium diet, smoking cessation and increasing his physical  activity. Follow up in 4 months or sooner if his readings are not in goal range.   Heavy smoker -discussed that smoking is a risk factor for heart disease, HTN and that stopping would help with his BP and overall health.   Vitamin D deficiency -continue taking supplement.   Intermittent palpitations -discussed referral to cardiology or further work up and he would like to hold off for now. Advised that if he has any worsening or more frequent episodes or chest pain, DOE or any other symptoms that he should seek immediate medical attention.   OSA on CPAP -continue using CPAP. I expect this to help lower BP.

## 2019-11-16 ENCOUNTER — Ambulatory Visit (INDEPENDENT_AMBULATORY_CARE_PROVIDER_SITE_OTHER): Payer: 59 | Admitting: Family Medicine

## 2019-11-16 ENCOUNTER — Other Ambulatory Visit: Payer: Self-pay

## 2019-11-16 ENCOUNTER — Encounter: Payer: Self-pay | Admitting: Family Medicine

## 2019-11-16 VITALS — BP 136/84 | HR 84 | Temp 97.8°F | Wt 325.8 lb

## 2019-11-16 DIAGNOSIS — R002 Palpitations: Secondary | ICD-10-CM

## 2019-11-16 DIAGNOSIS — E559 Vitamin D deficiency, unspecified: Secondary | ICD-10-CM

## 2019-11-16 DIAGNOSIS — F17200 Nicotine dependence, unspecified, uncomplicated: Secondary | ICD-10-CM | POA: Diagnosis not present

## 2019-11-16 DIAGNOSIS — E1159 Type 2 diabetes mellitus with other circulatory complications: Secondary | ICD-10-CM

## 2019-11-16 DIAGNOSIS — G4733 Obstructive sleep apnea (adult) (pediatric): Secondary | ICD-10-CM

## 2019-11-16 DIAGNOSIS — I10 Essential (primary) hypertension: Secondary | ICD-10-CM

## 2019-11-16 DIAGNOSIS — Z9989 Dependence on other enabling machines and devices: Secondary | ICD-10-CM

## 2019-11-16 DIAGNOSIS — I152 Hypertension secondary to endocrine disorders: Secondary | ICD-10-CM

## 2019-11-16 NOTE — Patient Instructions (Signed)
Check your BP at home.   Continue taking all your medications.   If you are not seeing readings less than 130/80 on a regular basis, let me know.   Make sure you are eating a low sodium diet.   I will see you back in 4 months or sooner if you are having more palpitations or your blood pressure is not controlled.    DASH Eating Plan DASH stands for "Dietary Approaches to Stop Hypertension." The DASH eating plan is a healthy eating plan that has been shown to reduce high blood pressure (hypertension). It may also reduce your risk for type 2 diabetes, heart disease, and stroke. The DASH eating plan may also help with weight loss. What are tips for following this plan?  General guidelines  Avoid eating more than 2,300 mg (milligrams) of salt (sodium) a day. If you have hypertension, you may need to reduce your sodium intake to 1,500 mg a day.  Limit alcohol intake to no more than 1 drink a day for nonpregnant women and 2 drinks a day for men. One drink equals 12 oz of beer, 5 oz of wine, or 1 oz of hard liquor.  Work with your health care provider to maintain a healthy body weight or to lose weight. Ask what an ideal weight is for you.  Get at least 30 minutes of exercise that causes your heart to beat faster (aerobic exercise) most days of the week. Activities may include walking, swimming, or biking.  Work with your health care provider or diet and nutrition specialist (dietitian) to adjust your eating plan to your individual calorie needs. Reading food labels   Check food labels for the amount of sodium per serving. Choose foods with less than 5 percent of the Daily Value of sodium. Generally, foods with less than 300 mg of sodium per serving fit into this eating plan.  To find whole grains, look for the word "whole" as the first word in the ingredient list. Shopping  Buy products labeled as "low-sodium" or "no salt added."  Buy fresh foods. Avoid canned foods and premade or  frozen meals. Cooking  Avoid adding salt when cooking. Use salt-free seasonings or herbs instead of table salt or sea salt. Check with your health care provider or pharmacist before using salt substitutes.  Do not fry foods. Cook foods using healthy methods such as baking, boiling, grilling, and broiling instead.  Cook with heart-healthy oils, such as olive, canola, soybean, or sunflower oil. Meal planning  Eat a balanced diet that includes: ? 5 or more servings of fruits and vegetables each day. At each meal, try to fill half of your plate with fruits and vegetables. ? Up to 6-8 servings of whole grains each day. ? Less than 6 oz of lean meat, poultry, or fish each day. A 3-oz serving of meat is about the same size as a deck of cards. One egg equals 1 oz. ? 2 servings of low-fat dairy each day. ? A serving of nuts, seeds, or beans 5 times each week. ? Heart-healthy fats. Healthy fats called Omega-3 fatty acids are found in foods such as flaxseeds and coldwater fish, like sardines, salmon, and mackerel.  Limit how much you eat of the following: ? Canned or prepackaged foods. ? Food that is high in trans fat, such as fried foods. ? Food that is high in saturated fat, such as fatty meat. ? Sweets, desserts, sugary drinks, and other foods with added sugar. ? Full-fat dairy  products.  Do not salt foods before eating.  Try to eat at least 2 vegetarian meals each week.  Eat more home-cooked food and less restaurant, buffet, and fast food.  When eating at a restaurant, ask that your food be prepared with less salt or no salt, if possible. What foods are recommended? The items listed may not be a complete list. Talk with your dietitian about what dietary choices are best for you. Grains Whole-grain or whole-wheat bread. Whole-grain or whole-wheat pasta. Brown rice. [place]. Bulgur. Whole-grain and low-sodium cereals. Pita bread. Low-fat, low-sodium crackers. Whole-wheat flour  tortillas. Vegetables Fresh or frozen vegetables (raw, steamed, roasted, or grilled). Low-sodium or reduced-sodium tomato and vegetable juice. Low-sodium or reduced-sodium tomato sauce and tomato paste. Low-sodium or reduced-sodium canned vegetables. Fruits All fresh, dried, or frozen fruit. Canned fruit in natural juice (without added sugar). Meat and other protein foods Skinless chicken or [place]. Ground chicken or [place]. Pork with fat trimmed off. Fish and seafood. Egg whites. Dried beans, peas, or lentils. Unsalted nuts, nut butters, and seeds. Unsalted canned beans. Lean cuts of beef with fat trimmed off. Low-sodium, lean deli meat. Dairy Low-fat (1%) or fat-free (skim) milk. Fat-free, low-fat, or reduced-fat cheeses. Nonfat, low-sodium ricotta or cottage cheese. Low-fat or nonfat yogurt. Low-fat, low-sodium cheese. Fats and oils Soft margarine without trans fats. Vegetable oil. Low-fat, reduced-fat, or light mayonnaise and salad dressings (reduced-sodium). Canola, safflower, olive, soybean, and sunflower oils. Avocado. Seasoning and other foods Herbs. Spices. Seasoning mixes without salt. Unsalted popcorn and pretzels. Fat-free sweets. What foods are not recommended? The items listed may not be a complete list. Talk with your dietitian about what dietary choices are best for you. Grains Baked goods made with fat, such as croissants, muffins, or some breads. Dry pasta or rice meal packs. Vegetables Creamed or fried vegetables. Vegetables in a cheese sauce. Regular canned vegetables (not low-sodium or reduced-sodium). Regular canned tomato sauce and paste (not low-sodium or reduced-sodium). Regular tomato and vegetable juice (not low-sodium or reduced-sodium). Angie Fava. Olives. Fruits Canned fruit in a light or heavy syrup. Fried fruit. Fruit in cream or butter sauce. Meat and other protein foods Fatty cuts of meat. Ribs. Fried meat. Berniece Salines. Sausage. Bologna and other processed lunch meats.  Salami. Fatback. Hotdogs. Bratwurst. Salted nuts and seeds. Canned beans with added salt. Canned or smoked fish. Whole eggs or egg yolks. Chicken or [place] with skin. Dairy Whole or 2% milk, cream, and half-and-half. Whole or full-fat cream cheese. Whole-fat or sweetened yogurt. Full-fat cheese. Nondairy creamers. Whipped toppings. Processed cheese and cheese spreads. Fats and oils Butter. Stick margarine. Lard. Shortening. Ghee. Bacon fat. Tropical oils, such as coconut, palm kernel, or palm oil. Seasoning and other foods Salted popcorn and pretzels. Onion salt, garlic salt, seasoned salt, table salt, and sea salt. Worcestershire sauce. Tartar sauce. Barbecue sauce. Teriyaki sauce. Soy sauce, including reduced-sodium. Steak sauce. Canned and packaged gravies. Fish sauce. Oyster sauce. Cocktail sauce. Horseradish that you find on the shelf. Ketchup. Mustard. Meat flavorings and tenderizers. Bouillon cubes. Hot sauce and Tabasco sauce. Premade or packaged marinades. Premade or packaged taco seasonings. Relishes. Regular salad dressings. Where to find more information:  National Heart, Lung, and [place]: https://wilson-eaton.com/  American Heart Association: www.heart.org Summary  The DASH eating plan is a healthy eating plan that has been shown to reduce high blood pressure (hypertension). It may also reduce your risk for type 2 diabetes, heart disease, and stroke.  With the DASH eating plan, you should limit  salt (sodium) intake to 2,300 mg a day. If you have hypertension, you may need to reduce your sodium intake to 1,500 mg a day.  When on the DASH eating plan, aim to eat more fresh fruits and vegetables, whole grains, lean proteins, low-fat dairy, and heart-healthy fats.  Work with your health care provider or diet and nutrition specialist (dietitian) to adjust your eating plan to your individual calorie needs. This information is not intended to replace advice given to you by your health  care provider. Make sure you discuss any questions you have with your health care provider. Document Revised: 05/24/2017 Document Reviewed: 06/04/2016 Elsevier Patient Education  2020 [employer].

## 2019-11-27 ENCOUNTER — Other Ambulatory Visit: Payer: Self-pay | Admitting: Internal Medicine

## 2019-11-27 ENCOUNTER — Other Ambulatory Visit: Payer: Self-pay | Admitting: Family Medicine

## 2019-11-27 NOTE — Progress Notes (Signed)
Name: Tim Allen  Age/ Sex: 44 y.o., male   MRN/ DOB: [id number], 1976/02/07     PCP: Girtha Rm, NP-C   Reason for Endocrinology Evaluation: Type 2 Diabetes Mellitus  Initial Endocrine Consultative Visit: 06/16/18    PATIENT IDENTIFIER: Tim Allen is a 44 y.o. male with a past medical history of HTN, Dyslipidemia,T2DM and OSA. The patient has followed with Endocrinology clinic since 06/16/18 for consultative assistance with management of his diabetes.  DIABETIC HISTORY:  [name] was diagnosed with T2DM in 08/2017, has been on oral glycemic agents since his diagnosis. His hemoglobin A1c has ranged from 7.1% in 08/2017, peaking at 13.9% in 08/2016.   On his presentation he has been on Metformin XR , farxiga and trulicity.   SUBJECTIVE:   During the last visit (07/31/2019): A1c 8.1%, continued metformin, restarted Trulicity and continued farxiga.    Today (11/30/2019): [name] is for a follow-up on his diabetes.  He check his blood sugars 0 times daily . 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 .Otherwise, the patient has not required any recent emergency interventions for hypoglycemia and has not had recent hospitalizations secondary to hyper or hypoglycemic episodes.     HOME DIABETES REGIMEN:  Metformin 8657 mg daily Trulicity 1.5 mg weekly Jardiance 25 mg daily     METER DOWNLOAD SUMMARY: Does not check     HISTORY:  Past Medical History:  Past Medical History:  Diagnosis Date  . Heavy cigarette smoker   . Morbid obesity ([place])   . Sleep apnea   . Type 2 diabetes mellitus ([place])    new diagnosis with HgB A1c 13.9% on 08/27/16  . Vitamin D deficiency 06/16/2018   Past Surgical History:  Past Surgical History:  Procedure Laterality Date  . KNEE SURGERY Right age 76   reconstruction after traumatic injury    Social History:  reports that he has been smoking. He has a 20.00 pack-year smoking history. He has  never used smokeless tobacco. He reports current alcohol use. He reports that he does not use drugs. Family History:  Family History  Problem Relation Age of Onset  . Diabetes Father        sepsis. died at 51  . Sickle cell trait Son   . Mental illness Son      HOME MEDICATIONS: Allergies as of 11/30/2019   No Known Allergies     Medication List       Accurate as of November 30, 2019 10:13 AM. If you have any questions, ask your nurse or doctor.        atorvastatin 20 MG tablet Commonly known as: LIPITOR TAKE 1 TABLET (20 MG TOTAL) BY MOUTH DAILY.   ibuprofen 800 MG tablet Commonly known as: ADVIL Take 1 tablet (800 mg total) by mouth 3 (three) times daily.   Jardiance 25 MG Tabs tablet Generic drug: empagliflozin Take 25 mg by mouth daily before breakfast.   lisinopril 10 MG tablet Commonly known as: ZESTRIL Take 1 tablet (10 mg total) by mouth daily.   metFORMIN 1000 MG tablet Commonly known as: GLUCOPHAGE Take 1 tablet (1,000 mg total) by mouth daily with breakfast.   onetouch ultrasoft lancets Use as instructed to test blood sugar 2 times daily E11.65   OneTouch Verio test strip Generic drug: glucose blood Use as instructed to test blood sugar 2 times daily E11.65   OneTouch Verio  w/Device Kit 1 kit by Does not apply route daily.   Trulicity 1.5 JO/8.4ZY Sopn Generic drug: Dulaglutide Inject 1.5 mg into the skin once a week.   Vitamin D (Ergocalciferol) 1.25 MG (50000 UNIT) Caps capsule Commonly known as: DRISDOL TAKE 1 CAPSULE (50 000 UNITS TOTAL) BY MOUTH EVERY 7 (SEVEN) DAYS.        OBJECTIVE:   Vital Signs: BP 130/84 (BP Location: Left Arm, Patient Position: Sitting, Cuff Size: Large)   Pulse 92   Temp 98.5 F (36.9 C) (Oral)   Ht 6' 1"  (1.854 m)   Wt (!) 326 lb 3.2 oz (148 kg)   SpO2 97%   BMI 43.04 kg/m   Wt Readings from Last 3 Encounters:  11/30/19 (!) 326 lb 3.2 oz (148 kg)  11/16/19 (!) 325 lb 12.8 oz (147.8 kg)  10/05/19 (!) 322  lb 3.2 oz (146.1 kg)     Exam: General: Pt appears well and is in NAD  Neck: General: Supple without adenopathy. Thyroid: Thyroid size normal.  No goiter or nodules appreciated. No thyroid bruit.  Chest wall:  No evidence of gynecomastia  Lungs: Clear with good BS bilat with no rales, rhonchi, or wheezes  Heart: RRR with normal S1 and S2 and no gallops; no murmurs; no rub  Abdomen: Normoactive bowel sounds, soft, nontender.  Extremities: No pretibial edema.   Neuro: MS is good with appropriate affect, pt is alert and Ox3    DM foot exam:07/31/2019 The skin of the feet is intact without sores or ulcerations. The pedal pulses are 2+ on right and 2+ on left. The sensation is intact to a screening 5.07, 10 gram monofilament bilaterally            DATA REVIEWED:  Lab Results  Component Value Date   HGBA1C 7.3 (A) 11/30/2019   HGBA1C 8.1 (A) 07/31/2019   HGBA1C 8.7 (A) 06/04/2018   Lab Results  Component Value Date   MICROALBUR 0.6 08/27/2016   LDLCALC 53 10/05/2019   CREATININE 0.97 10/05/2019   Lab Results  Component Value Date   MICRALBCREAT 3.0 09/09/2017    Results for Tim Allen (MRN [id number]) as of 08/03/2019 12:51  Ref. Range 07/31/2019 14:18  Sodium Latest Ref Range: 135 - 145 mEq/L 136  Potassium Latest Ref Range: 3.5 - 5.1 mEq/L 4.1  Chloride Latest Ref Range: 96 - 112 mEq/L 101  CO2 Latest Ref Range: 19 - 32 mEq/L 28  Glucose Latest Ref Range: 70 - 99 mg/dL 244 (H)  BUN Latest Ref Range: 6 - 23 mg/dL 9  Creatinine Latest Ref Range: 0.40 - 1.50 mg/dL 0.86  Calcium Latest Ref Range: 8.4 - 10.5 mg/dL 9.9  GFR Latest Ref Range: >60.00 mL/min 116.97           ASSESSMENT / PLAN / RECOMMENDATIONS:   1) Type 2 diabetes Mellitus, Sub-optimally  controlled, With neuropathic complications - Most recent A1c of 7.3 %. Goal A1c <7.0 %.    - Improved glycemic control , encouraged compliance. Limited lifestyle changes, as the pt is a truck driver - He does  not check glucose despite prior counceling on this.  - No changes at this time.     MEDICATIONS: -Continue Metformin 1000 mg daily  -Continue Trulicity  1.5 mg weekly ( Sunday )  -Continue Jardiance 25 mg daily   EDUCATION / INSTRUCTIONS:  BG monitoring instructions: Patient is instructed to check his blood sugars 2 times a day, fasting and bedtime.  Call  [place] [facility] if: BG persistently < 70 or > 300. . I reviewed the Rule of 15 for the treatment of hypoglycemia in detail with the patient. Literature supplied.   F/U in 4 months   Signed electronically by: Mack Guise, MD  [facility] End[facility]  [facility] [address] [place], [place] [address] Phone: [phone] FAX: [phone]   CC: Girtha Rm, NP-C [address] [address] Phone: [phone]  Fax: [phone]  Return to Endocrinology clinic as below: Future Appointments  Date Time Provider [place]  03/21/2020  8:30 AM Girtha Rm, NP-C [facility]  04/04/2020  8:30 AM Malick Netz, Melanie Crazier, MD [facility] None

## 2019-11-30 ENCOUNTER — Encounter: Payer: Self-pay | Admitting: Internal Medicine

## 2019-11-30 ENCOUNTER — Ambulatory Visit (INDEPENDENT_AMBULATORY_CARE_PROVIDER_SITE_OTHER): Payer: 59 | Admitting: Internal Medicine

## 2019-11-30 VITALS — BP 130/84 | HR 92 | Temp 98.5°F | Ht 73.0 in | Wt 326.2 lb

## 2019-11-30 DIAGNOSIS — E1142 Type 2 diabetes mellitus with diabetic polyneuropathy: Secondary | ICD-10-CM | POA: Diagnosis not present

## 2019-11-30 DIAGNOSIS — E1165 Type 2 diabetes mellitus with hyperglycemia: Secondary | ICD-10-CM

## 2019-11-30 LAB — POCT GLYCOSYLATED HEMOGLOBIN (HGB A1C): Hemoglobin A1C: 7.3 % — AB (ref 4.0–5.6)

## 2019-11-30 NOTE — Patient Instructions (Signed)
-   You are going in the right direction, Keep up the good work.  - Continue  Metformin1000 mg daily  - Contoinue Trulicity 1.5  mg weekly  - Continue Jardiance 25 mg daily      HOW TO TREAT LOW BLOOD SUGARS (Blood sugar LESS THAN 70 MG/DL)  Please follow the RULE OF 15 for the treatment of hypoglycemia treatment (when your (blood sugars are less than 70 mg/dL)    STEP 1: Take 15 grams of carbohydrates when your blood sugar is low, which includes:   3-4 GLUCOSE TABS  OR  3-4 OZ OF JUICE OR REGULAR SODA OR  ONE TUBE OF GLUCOSE GEL     STEP 2: RECHECK blood sugar in 15 MINUTES STEP 3: If your blood sugar is still low at the 15 minute recheck --> then, go back to STEP 1 and treat AGAIN with another 15 grams of carbohydrates.

## 2020-02-23 ENCOUNTER — Other Ambulatory Visit: Payer: Self-pay | Admitting: Family Medicine

## 2020-03-21 ENCOUNTER — Ambulatory Visit: Payer: 59 | Admitting: Family Medicine

## 2020-03-21 ENCOUNTER — Other Ambulatory Visit: Payer: Self-pay

## 2020-04-04 ENCOUNTER — Ambulatory Visit (INDEPENDENT_AMBULATORY_CARE_PROVIDER_SITE_OTHER): Payer: 59 | Admitting: Internal Medicine

## 2020-04-04 ENCOUNTER — Other Ambulatory Visit: Payer: Self-pay

## 2020-04-04 ENCOUNTER — Encounter: Payer: Self-pay | Admitting: Internal Medicine

## 2020-04-04 VITALS — BP 118/64 | HR 88 | Wt 327.8 lb

## 2020-04-04 DIAGNOSIS — E1142 Type 2 diabetes mellitus with diabetic polyneuropathy: Secondary | ICD-10-CM | POA: Diagnosis not present

## 2020-04-04 DIAGNOSIS — E1165 Type 2 diabetes mellitus with hyperglycemia: Secondary | ICD-10-CM | POA: Insufficient documentation

## 2020-04-04 LAB — POCT GLUCOSE (DEVICE FOR HOME USE): Glucose Fasting, POC: 173 mg/dL — AB (ref 70–99)

## 2020-04-04 LAB — POCT GLYCOSYLATED HEMOGLOBIN (HGB A1C): Hemoglobin A1C: 8.4 % — AB (ref 4.0–5.6)

## 2020-04-04 MED ORDER — TRULICITY 3 MG/0.5ML ~~LOC~~ SOAJ: 3.0000 mg | SUBCUTANEOUS | 3 refills | Status: DC

## 2020-04-04 MED ORDER — METFORMIN HCL 1000 MG PO TABS
1000.0000 mg | ORAL_TABLET | Freq: Every day | ORAL | 3 refills | Status: DC
Start: 2020-04-04 — End: 2020-06-23

## 2020-04-04 NOTE — Progress Notes (Signed)
Name: Tim Allen  Age/ Sex: 44 y.o., male   MRN/ DOB: [id number], 04/12/1976     PCP: Girtha Rm, NP-C   Reason for Endocrinology Evaluation: Type 2 Diabetes Mellitus  Initial Endocrine Consultative Visit: 06/16/18    PATIENT IDENTIFIER: Mr. LAWRNCE Allen is a 44 y.o. male with a past medical history of HTN, Dyslipidemia,T2DM and OSA. The patient has followed with Endocrinology clinic since 06/16/18 for consultative assistance with management of his diabetes.  DIABETIC HISTORY:  [name] was diagnosed with T2DM in 08/2017, has been on oral glycemic agents since his diagnosis. His hemoglobin A1c has ranged from 7.1% in 08/2017, peaking at 13.9% in 08/2016.   On his presentation he has been on Metformin XR , farxiga and trulicity.   SUBJECTIVE:   During the last visit (11/30/2019): A1c 7.3 %, continued metformin, Trulicity and Jardiance    Today (04/04/2020): Mr. [name] is for a follow-up on his diabetes.  He check his blood sugars 0 times daily . 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 .Otherwise, the patient has not required any recent emergency interventions for hypoglycemia and has not had recent hospitalizations secondary to hyper or hypoglycemic episodes.   Denies nausea or vomiting   HOME DIABETES REGIMEN:  Metformin 9509 mg daily Trulicity 1.5 mg weekly Jardiance 25 mg daily     METER DOWNLOAD SUMMARY: Does not check   DIABETIC COMPLICATIONS: Microvascular complications:   Denies: neuropathy , retinopathy , nephropathy  Last eye exam: Completed years ago  Macrovascular complications:    Denies: CAD, PVD, CVA  HISTORY:  Past Medical History:  Past Medical History:  Diagnosis Date  . Heavy cigarette smoker   . Morbid obesity ([place])   . Sleep apnea   . Type 2 diabetes mellitus ([place])    new diagnosis with HgB A1c 13.9% on 08/27/16  . Vitamin D deficiency 06/16/2018   Past Surgical History:  Past Surgical  History:  Procedure Laterality Date  . KNEE SURGERY Right age 60   reconstruction after traumatic injury    Social History:  reports that he has been smoking. He has a 20.00 pack-year smoking history. He has never used smokeless tobacco. He reports current alcohol use. He reports that he does not use drugs. Family History:  Family History  Problem Relation Age of Onset  . Diabetes Father        sepsis. died at 3  . Sickle cell trait Son   . Mental illness Son      HOME MEDICATIONS: Allergies as of 04/04/2020   No Known Allergies     Medication List       Accurate as of April 04, 2020  7:38 AM. If you have any questions, ask your nurse or doctor.        atorvastatin 20 MG tablet Commonly known as: LIPITOR TAKE 1 TABLET (20 MG TOTAL) BY MOUTH DAILY.   ibuprofen 800 MG tablet Commonly known as: ADVIL Take 1 tablet (800 mg total) by mouth 3 (three) times daily.   Jardiance 25 MG Tabs tablet Generic drug: empagliflozin Take 25 mg by mouth daily before breakfast.   lisinopril 10 MG tablet Commonly known as: ZESTRIL Take 1 tablet (10 mg total) by mouth daily.   metFORMIN 1000 MG tablet Commonly known as: GLUCOPHAGE Take 1 tablet (1,000 mg total) by mouth daily with breakfast.   onetouch ultrasoft lancets Use as instructed to  test blood sugar 2 times daily E11.65   OneTouch Verio test strip Generic drug: glucose blood Use as instructed to test blood sugar 2 times daily E11.65   OneTouch Verio w/Device Kit 1 kit by Does not apply route daily.   Trulicity 1.5 OL/0.7EM Sopn Generic drug: Dulaglutide Inject 1.5 mg into the skin once a week.   Vitamin D (Ergocalciferol) 1.25 MG (50000 UNIT) Caps capsule Commonly known as: DRISDOL TAKE 1 CAPSULE (50 000 UNITS TOTAL) BY MOUTH EVERY 7 (SEVEN) DAYS.        OBJECTIVE:   Vital Signs: There were no vitals taken for this visit.  Wt Readings from Last 3 Encounters:  11/30/19 (!) 326 lb 3.2 oz (148 kg)   11/16/19 (!) 325 lb 12.8 oz (147.8 kg)  10/05/19 (!) 322 lb 3.2 oz (146.1 kg)     Exam: General: Pt appears well and is in NAD  Neck: General: Supple without adenopathy. Thyroid: Thyroid size normal.  No goiter or nodules appreciated. No thyroid bruit.  Chest wall:  No evidence of gynecomastia  Lungs: Clear with good BS bilat with no rales, rhonchi, or wheezes  Heart: RRR with normal S1 and S2 and no gallops; no murmurs; no rub  Abdomen: Normoactive bowel sounds, soft, nontender.  Extremities: No pretibial edema.   Neuro: MS is good with appropriate affect, pt is alert and Ox3    DM foot exam:07/31/2019 The skin of the feet is intact without sores or ulcerations. The pedal pulses are 2+ on right and 2+ on left. The sensation is intact to a screening 5.07, 10 gram monofilament bilaterally            DATA REVIEWED:  Lab Results  Component Value Date   HGBA1C 7.3 (A) 11/30/2019   HGBA1C 8.1 (A) 07/31/2019   HGBA1C 8.7 (A) 06/04/2018   Lab Results  Component Value Date   MICROALBUR 0.6 08/27/2016   LDLCALC 53 10/05/2019   CREATININE 0.97 10/05/2019   Lab Results  Component Value Date   MICRALBCREAT 3.0 09/09/2017    Results for Tim Allen (MRN [id number]) as of 08/03/2019 12:51  Ref. Range 07/31/2019 14:18  Sodium Latest Ref Range: 135 - 145 mEq/L 136  Potassium Latest Ref Range: 3.5 - 5.1 mEq/L 4.1  Chloride Latest Ref Range: 96 - 112 mEq/L 101  CO2 Latest Ref Range: 19 - 32 mEq/L 28  Glucose Latest Ref Range: 70 - 99 mg/dL 244 (H)  BUN Latest Ref Range: 6 - 23 mg/dL 9  Creatinine Latest Ref Range: 0.40 - 1.50 mg/dL 0.86  Calcium Latest Ref Range: 8.4 - 10.5 mg/dL 9.9  GFR Latest Ref Range: >60.00 mL/min 116.97         In-office Bg 173 mg/dL   ASSESSMENT / PLAN / RECOMMENDATIONS:   1) Type 2 diabetes Mellitus, Poorly  controlled, With neuropathic complications - Most recent A1c of 8.4 %. Goal A1c <7.0 %.   -  Poorly controlled diabetes - He does not  check glucose at home with the freestyle libre - He had made it clear in the past that he does follow a low carb diet due to his work circumstances as a Administrator.  - He is intolerant to higher doses of metformin - We dicussed increasing Trulicity dose vs adding SU  - After discussing the risk and benefits of both classes, increasing GI side effects with Trulicity and weight gain with SU. Pt opted for increase trulicity at this time  MEDICATIONS: -Continue Metformin 1000 mg daily  -Increase Trulicity to 3 mg weekly ( Sundays )  -Continue Jardiance 25 mg daily   EDUCATION / INSTRUCTIONS:  BG monitoring instructions: Patient is instructed to check his blood sugars 1 times a day  Call [place] [facility] if: BG persistently < 70  . I reviewed the Rule of 15 for the treatment of hypoglycemia in detail with the patient. Literature supplied.   F/U in 4 months   Signed electronically by: Mack Guise, MD  [facility] End[facility]inology  [facility] [address] [place], [place] [address] Phone: [phone] FAX: [phone]   CC: Girtha Rm, NP-C [address] [address] Phone: [phone]  Fax: [phone]  Return to Endocrinology clinic as below: Future Appointments  Date Time Provider [place]  04/04/2020  8:30 AM Jahmel Flannagan, Melanie Crazier, MD [facility] None

## 2020-04-04 NOTE — Patient Instructions (Signed)
-   Continue  Metformin1000 mg daily  - Increase Trulicity 3  mg weekly  - Continue Jardiance 25 mg daily      HOW TO TREAT LOW BLOOD SUGARS (Blood sugar LESS THAN 70 MG/DL)  Please follow the RULE OF 15 for the treatment of hypoglycemia treatment (when your (blood sugars are less than 70 mg/dL)    STEP 1: Take 15 grams of carbohydrates when your blood sugar is low, which includes:   3-4 GLUCOSE TABS  OR  3-4 OZ OF JUICE OR REGULAR SODA OR  ONE TUBE OF GLUCOSE GEL     STEP 2: RECHECK blood sugar in 15 MINUTES STEP 3: If your blood sugar is still low at the 15 minute recheck --> then, go back to STEP 1 and treat AGAIN with another 15 grams of carbohydrates.

## 2020-04-05 ENCOUNTER — Telehealth: Payer: Self-pay | Admitting: Internal Medicine

## 2020-04-05 MED ORDER — TRULICITY 3 MG/0.5ML ~~LOC~~ SOAJ: 3.0000 mg | SUBCUTANEOUS | 3 refills | Status: DC

## 2020-04-05 NOTE — Telephone Encounter (Signed)
[facility], [address], [place] called to advise that patients Trulicity was increased to 3 MG.  New RX needs to be sent

## 2020-04-05 NOTE — Telephone Encounter (Signed)
Script sent to correct pharmacy

## 2020-05-30 ENCOUNTER — Other Ambulatory Visit: Payer: Self-pay | Admitting: Family Medicine

## 2020-05-30 DIAGNOSIS — I152 Hypertension secondary to endocrine disorders: Secondary | ICD-10-CM

## 2020-05-30 DIAGNOSIS — E1159 Type 2 diabetes mellitus with other circulatory complications: Secondary | ICD-10-CM

## 2020-06-23 ENCOUNTER — Other Ambulatory Visit: Payer: Self-pay | Admitting: *Deleted

## 2020-06-23 ENCOUNTER — Encounter: Payer: Self-pay | Admitting: Internal Medicine

## 2020-06-23 MED ORDER — TRULICITY 3 MG/0.5ML ~~LOC~~ SOAJ
3.0000 mg | SUBCUTANEOUS | 3 refills | Status: DC
Start: 2020-06-23 — End: 2020-08-22

## 2020-06-23 MED ORDER — METFORMIN HCL 1000 MG PO TABS
1000.0000 mg | ORAL_TABLET | Freq: Every day | ORAL | 1 refills | Status: DC
Start: 2020-06-23 — End: 2020-10-03

## 2020-06-23 MED ORDER — EMPAGLIFLOZIN 25 MG PO TABS
25.0000 mg | ORAL_TABLET | Freq: Every day | ORAL | 1 refills | Status: DC
Start: 2020-06-23 — End: 2020-12-29

## 2020-07-14 ENCOUNTER — Encounter: Payer: Self-pay | Admitting: Family Medicine

## 2020-07-14 ENCOUNTER — Telehealth (INDEPENDENT_AMBULATORY_CARE_PROVIDER_SITE_OTHER): Payer: 59 | Admitting: Family Medicine

## 2020-07-14 ENCOUNTER — Other Ambulatory Visit: Payer: Self-pay

## 2020-07-14 VITALS — Wt 330.0 lb

## 2020-07-14 DIAGNOSIS — Z20822 Contact with and (suspected) exposure to covid-19: Secondary | ICD-10-CM

## 2020-07-14 DIAGNOSIS — R52 Pain, unspecified: Secondary | ICD-10-CM

## 2020-07-14 DIAGNOSIS — R509 Fever, unspecified: Secondary | ICD-10-CM | POA: Diagnosis not present

## 2020-07-14 NOTE — Progress Notes (Signed)
   Subjective:  Documentation for virtual audio and video telecommunications through [place] encounter:  The patient was located at home. 2 patient identifiers used.  The provider was located in the office. The patient did consent to this visit and is aware of possible charges through their insurance for this visit.  The other persons participating in this telemedicine service were none. Time spent on call was 12 minutes and in review of previous records >16 minutes total.  This virtual service is not related to other E/M service within previous 7 days.   Patient ID: Tim Allen, male    DOB: 08-04-1975, 45 y.o.   MRN: [id number]  HPI Chief Complaint  Patient presents with  . sick    Sick- fever, body aches, covid exposure   Complains of a 4 day history of fever, chills, body aches, headache and sore throat. States he had some mild shortness of breath 2 nights ago.  He still has some chills today.   His wife is positive for Covid.   No dizziness, chest pain, palpitations, abdominal pain, N/V/D.   Taking aspirin.  States he has not needed a lot of medication.   Received 2 Covid vaccines.   Reviewed allergies, medications, past medical, surgical, family, and social history.     Review of Systems Pertinent positives and negatives in the history of present illness.     Objective:   Physical Exam Wt (!) 330 lb (149.7 kg)   BMI 43.54 kg/m   Alert and oriented and in no acute distress.  Respirations unlabored.  Speaking in complete sentences without difficulty.      Assessment & Plan:  Fever and chills  Close exposure to 2019 novel coronavirus  Body aches  He declines to come for COVID testing.  Discussed that since his wife is positive that we assume he is also positive for COVID.  Discussed quarantine.  He will need to quarantine the full 10 days due to still being symptomatic.  Discussed supportive care.  He will follow-up if worsening or if he has any  questions.

## 2020-08-08 ENCOUNTER — Ambulatory Visit: Payer: 59 | Admitting: Internal Medicine

## 2020-08-22 ENCOUNTER — Telehealth: Payer: Self-pay | Admitting: Internal Medicine

## 2020-08-22 ENCOUNTER — Other Ambulatory Visit: Payer: Self-pay | Admitting: Family Medicine

## 2020-08-22 DIAGNOSIS — E1159 Type 2 diabetes mellitus with other circulatory complications: Secondary | ICD-10-CM

## 2020-08-22 MED ORDER — TRULICITY 3 MG/0.5ML ~~LOC~~ SOAJ: 3.0000 mg | SUBCUTANEOUS | 1 refills | Status: DC

## 2020-08-22 NOTE — Telephone Encounter (Signed)
MEDICATION: Trulicty  PHARMACY:  [facility] [address]  HAS THE PATIENT CONTACTED THEIR PHARMACY?    IS THIS A 90 DAY SUPPLY : unknown  IS PATIENT OUT OF MEDICATION: yes  IF NOT; HOW MUCH IS LEFT:   LAST APPOINTMENT DATE: @12 /30/2021  NEXT APPOINTMENT DATE:@4 /04/2021  DO WE HAVE YOUR PERMISSION TO LEAVE A DETAILED MESSAGE?:  OTHER COMMENTS:    **Let patient know to contact pharmacy at the end of the day to make sure medication is ready. **  ** Please notify patient to allow 48-72 hours to process**  **Encourage patient to contact the pharmacy for refills or they can request refills through [facility]**

## 2020-08-22 NOTE — Telephone Encounter (Signed)
MEDICATION: Trulicity  PHARMACY:  [facility] HAS THE PATIENT CONTACTED THEIR PHARMACY?    IS THIS A 90 DAY SUPPLY :   IS PATIENT OUT OF MEDICATION:   IF NOT; HOW MUCH IS LEFT:   LAST APPOINTMENT DATE: @12 /30/2021  NEXT APPOINTMENT DATE:@4 /04/2021  DO WE HAVE YOUR PERMISSION TO LEAVE A DETAILED MESSAGE?:  OTHER COMMENTS:    **Let patient know to contact pharmacy at the end of the day to make sure medication is ready. **  ** Please notify patient to allow 48-72 hours to process**  **Encourage patient to contact the pharmacy for refills or they can request refills through [facility]**

## 2020-08-22 NOTE — Telephone Encounter (Signed)
Refill sent as requested. 

## 2020-08-23 MED ORDER — ATORVASTATIN CALCIUM 20 MG PO TABS: 20.0000 mg | ORAL_TABLET | Freq: Every day | ORAL | 0 refills | Status: DC

## 2020-08-23 MED ORDER — LISINOPRIL 10 MG PO TABS: 10.0000 mg | ORAL_TABLET | Freq: Every day | ORAL | 0 refills | Status: DC

## 2020-10-03 ENCOUNTER — Other Ambulatory Visit: Payer: Self-pay

## 2020-10-03 ENCOUNTER — Encounter: Payer: Self-pay | Admitting: Internal Medicine

## 2020-10-03 ENCOUNTER — Ambulatory Visit (INDEPENDENT_AMBULATORY_CARE_PROVIDER_SITE_OTHER): Payer: 59 | Admitting: Internal Medicine

## 2020-10-03 VITALS — BP 130/82 | HR 90 | Ht 73.0 in | Wt 326.2 lb

## 2020-10-03 DIAGNOSIS — E1165 Type 2 diabetes mellitus with hyperglycemia: Secondary | ICD-10-CM | POA: Diagnosis not present

## 2020-10-03 DIAGNOSIS — R7989 Other specified abnormal findings of blood chemistry: Secondary | ICD-10-CM

## 2020-10-03 DIAGNOSIS — E559 Vitamin D deficiency, unspecified: Secondary | ICD-10-CM | POA: Diagnosis not present

## 2020-10-03 DIAGNOSIS — E1142 Type 2 diabetes mellitus with diabetic polyneuropathy: Secondary | ICD-10-CM | POA: Diagnosis not present

## 2020-10-03 LAB — BASIC METABOLIC PANEL
BUN: 11 mg/dL (ref 6–23)
CO2: 29 mEq/L (ref 19–32)
Calcium: 9.7 mg/dL (ref 8.4–10.5)
Chloride: 100 mEq/L (ref 96–112)
Creatinine, Ser: 0.87 mg/dL (ref 0.40–1.50)
GFR: 104.56 mL/min (ref >60.00)
Glucose, Bld: 135 mg/dL — ABNORMAL HIGH (ref 70–99)
Potassium: 4.5 mEq/L (ref 3.5–5.1)
Sodium: 139 mEq/L (ref 135–145)

## 2020-10-03 LAB — PSA: PSA: 0.64 ng/mL (ref 0.10–4.00)

## 2020-10-03 LAB — MICROALBUMIN / CREATININE URINE RATIO
Creatinine,U: 89.9 mg/dL
Microalb Creat Ratio: 0.8 mg/g (ref 0.0–30.0)
Microalb, Ur: <0.7 mg/dL (ref 0.0–1.9)

## 2020-10-03 LAB — POCT GLYCOSYLATED HEMOGLOBIN (HGB A1C): Hemoglobin A1C: 8.2 % — AB (ref 4.0–5.6)

## 2020-10-03 LAB — FOLLICLE STIMULATING HORMONE: FSH: 5.6 m[IU]/mL (ref 1.4–18.1)

## 2020-10-03 LAB — POCT GLUCOSE (DEVICE FOR HOME USE): POC Glucose: 178 mg/dl — AB (ref 70–99)

## 2020-10-03 LAB — VITAMIN D 25 HYDROXY (VIT D DEFICIENCY, FRACTURES): VITD: 17.52 ng/mL — ABNORMAL LOW (ref 30.00–100.00)

## 2020-10-03 LAB — LUTEINIZING HORMONE: LH: 2.97 m[IU]/mL (ref 1.50–9.30)

## 2020-10-03 LAB — TSH: TSH: 1.9 u[IU]/mL (ref 0.35–4.50)

## 2020-10-03 LAB — T4, FREE: Free T4: 0.65 ng/dL (ref 0.60–1.60)

## 2020-10-03 MED ORDER — METFORMIN HCL ER 500 MG PO TB24: 2000.0000 mg | ORAL_TABLET | Freq: Every day | ORAL | 3 refills | Status: DC

## 2020-10-03 NOTE — Patient Instructions (Signed)
-   Change  Metformin to 500 mg XR 4 tablets daily  - Continue Trulicity 3  mg weekly  - Continue Jardiance 25 mg daily      HOW TO TREAT LOW BLOOD SUGARS (Blood sugar LESS THAN 70 MG/DL)  Please follow the RULE OF 15 for the treatment of hypoglycemia treatment (when your (blood sugars are less than 70 mg/dL)    STEP 1: Take 15 grams of carbohydrates when your blood sugar is low, which includes:   3-4 GLUCOSE TABS  OR  3-4 OZ OF JUICE OR REGULAR SODA OR  ONE TUBE OF GLUCOSE GEL     STEP 2: RECHECK blood sugar in 15 MINUTES STEP 3: If your blood sugar is still low at the 15 minute recheck --> then, go back to STEP 1 and treat AGAIN with another 15 grams of carbohydrates.

## 2020-10-03 NOTE — Progress Notes (Signed)
Name: Tim Allen  Age/ Sex: 45 y.o., male   MRN/ DOB: [id number], [birth year]     PCP: Girtha Rm, NP-C   Reason for Endocrinology Evaluation: Type 2 Diabetes Mellitus  Initial Endocrine Consultative Visit: 06/16/18    PATIENT IDENTIFIER: Mr. Tim Allen is a 45 y.o. male with a past medical history of HTN, Dyslipidemia,T2DM and OSA. The patient has followed with Endocrinology clinic since 06/16/18 for consultative assistance with management of his diabetes.  DIABETIC HISTORY:  Mr. Tim Allen was diagnosed with T2DM in 08/2017, has been on oral glycemic agents since his diagnosis. His hemoglobin A1c has ranged from 7.1% in 08/2017, peaking at 13.9% in 08/2016.   On his presentation he has been on Metformin XR , farxiga and trulicity.   SUBJECTIVE:   During the last visit (04/04/2020): A1c 8.4 %, continued metformin, Jardiance and increased Trulicity    Today (10/01/8117): Mr. Tim Allen is for a follow-up on his diabetes.He is accompanied by his wife today.   He check his blood sugars 0 times daily . He is a Administrator.Otherwise, the patient has not required any recent emergency interventions for hypoglycemia     Denies nausea or vomiting   He used to be on testosterone gel but this was impractical to use , he has been off of it for ~ 1 yr.  He has fatigue and erectile dysfunction as well as normal libido     HOME DIABETES REGIMEN:  Metformin 1000 mg daily- taking his wife's 500 mg 2 tablets daily  Trulicity 3 mg weekly ( Sundays) Jardiance 25 mg daily     METER DOWNLOAD SUMMARY: Does not check       DIABETIC COMPLICATIONS: Microvascular complications:    Denies: neuropathy , retinopathy , nephropathy  Last eye exam: Completed years ago  Macrovascular complications:    Denies: CAD, PVD, CVA  HISTORY:  Past Medical History:  Past Medical History:  Diagnosis Date  . Heavy cigarette smoker   . Morbid obesity ([place])   . Sleep apnea   . Type 2  diabetes mellitus ([place])    new diagnosis with HgB A1c 13.9% on 08/27/16  . Vitamin D deficiency 06/16/2018   Past Surgical History:  Past Surgical History:  Procedure Laterality Date  . KNEE SURGERY Right age 82   reconstruction after traumatic injury    Social History:  reports that he has been smoking. He has a 20.00 pack-year smoking history. He has never used smokeless tobacco. He reports current alcohol use. He reports that he does not use drugs. Family History:  Family History  Problem Relation Age of Onset  . Diabetes Father        sepsis. died at 73  . Sickle cell trait Son   . Mental illness Son      HOME MEDICATIONS: Allergies as of 10/03/2020   No Known Allergies     Medication List       Accurate as of October 03, 2020  8:43 AM. If you have any questions, ask your nurse or doctor.        atorvastatin 20 MG tablet Commonly known as: LIPITOR Take 1 tablet (20 mg total) by mouth daily.   empagliflozin 25 MG Tabs tablet Commonly known as: Jardiance Take 1 tablet (25 mg total) by mouth daily before breakfast.   ibuprofen 800 MG tablet Commonly known as: ADVIL Take 1 tablet (800 mg total) by mouth 3 (three) times daily.   lisinopril 10 MG tablet  Commonly known as: ZESTRIL Take 1 tablet (10 mg total) by mouth daily.   metFORMIN 1000 MG tablet Commonly known as: GLUCOPHAGE Take 1 tablet (1,000 mg total) by mouth daily with breakfast.   onetouch ultrasoft lancets Use as instructed to test blood sugar 2 times daily E11.65   OneTouch Verio test strip Generic drug: glucose blood Use as instructed to test blood sugar 2 times daily E11.65   OneTouch Verio w/Device Kit 1 kit by Does not apply route daily.   Trulicity 3 QQ/7.6PP Sopn Generic drug: Dulaglutide Inject 3 mg as directed once a week.   Vitamin D (Ergocalciferol) 1.25 MG (50000 UNIT) Caps capsule Commonly known as: DRISDOL TAKE 1 CAPSULE (50 000 UNITS TOTAL) BY MOUTH EVERY 7 (SEVEN) DAYS.         OBJECTIVE:   Vital Signs: BP 130/82   Pulse 90   Ht 6' 1" (1.854 m)   Wt (!) 326 lb 4 oz (148 kg)   SpO2 95%   BMI 43.04 kg/m   Wt Readings from Last 3 Encounters:  10/03/20 (!) 326 lb 4 oz (148 kg)  07/14/20 (!) 330 lb (149.7 kg)  04/04/20 (!) 327 lb 12.8 oz (148.7 kg)     Exam: General: Pt appears well and is in NAD  Neck: General: Supple without adenopathy. Thyroid: Thyroid size normal.  No goiter or nodules appreciated. No thyroid bruit.  Chest wall:  No evidence of gynecomastia  Lungs: Clear with good BS bilat with no rales, rhonchi, or wheezes  Heart: RRR with normal S1 and S2 and no gallops; no murmurs; no rub  Abdomen: Normoactive bowel sounds, soft, nontender.  Extremities: No pretibial edema.   Neuro: MS is good with appropriate affect, pt is alert and Ox3    DM foot exam:10/03/2020 The skin of the feet is intact without sores or ulcerations. The pedal pulses are 2+ on right and 2+ on left. The sensation is intact to a screening 5.07, 10 gram monofilament bilaterally     DATA REVIEWED:  Lab Results  Component Value Date   HGBA1C 8.2 (A) 10/03/2020   HGBA1C 8.4 (A) 04/04/2020   HGBA1C 7.3 (A) 11/30/2019   Results for Grondahl, TYWAN SIEVER (MRN [id number]) as of 10/04/2020 08:29  Ref. Range 10/03/2020 09:08  Sodium Latest Ref Range: 135 - 145 mEq/L 139  Potassium Latest Ref Range: 3.5 - 5.1 mEq/L 4.5  Chloride Latest Ref Range: 96 - 112 mEq/L 100  CO2 Latest Ref Range: 19 - 32 mEq/L 29  Glucose Latest Ref Range: 70 - 99 mg/dL 135 (H)  BUN Latest Ref Range: 6 - 23 mg/dL 11  Creatinine Latest Ref Range: 0.40 - 1.50 mg/dL 0.87  Calcium Latest Ref Range: 8.4 - 10.5 mg/dL 9.7  GFR Latest Ref Range: >60.00 mL/min 104.56  MICROALB/CREAT RATIO Latest Ref Range: 0.0 - 30.0 mg/g 0.8  VITD Latest Ref Range: 30.00 - 100.00 ng/mL 17.52 (L)  LH Latest Ref Range: 1.50 - 9.30 mIU/mL 2.97  FSH Latest Ref Range: 1.4 - 18.1 mIU/ML 5.6  Prolactin Latest Ref Range: 2.0 -  18.0 ng/mL 8.2  TSH Latest Ref Range: 0.35 - 4.50 uIU/mL 1.90  T4,Free(Direct) Latest Ref Range: 0.60 - 1.60 ng/dL 0.65  PSA Latest Ref Range: 0.10 - 4.00 ng/mL 0.64  Creatinine,U Latest Units: mg/dL 89.9  Microalb, Ur Latest Ref Range: 0.0 - 1.9 mg/dL <0.7    ASSESSMENT / PLAN / RECOMMENDATIONS:   1) Type 2 diabetes Mellitus, Poorly  controlled, With neuropathic  complications - Most recent A1c of 8.2 %. Goal A1c <7.0 %.  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 - He has told me in the past that he is intolerant to higher doses of metformin but today he would like to try XR formulations, will change   - We again discussed the possibility of adding SU , if increasing metformin will not improve his A1c to goal  - BMP MA/Cr ratio is normal   MEDICATIONS: -Change  Metformin to 500 mg XR, 4 tablets daily  -Continue Trulicity to 3 mg weekly ( Sundays )  -Continue Jardiance 25 mg daily   EDUCATION / INSTRUCTIONS:  BG monitoring instructions: Patient is instructed to check his blood sugars 1 times a day  Call [facility] if: BG persistently < 70  . I reviewed the Rule of 15 for the treatment of hypoglycemia in detail with the patient. Literature supplied.   2) Diabetic complications:   Eye: Unknown to have known diabetic retinopathy.   Neuro/ Feet: Does have known diabetic peripheral neuropathy. Based on loss of sensation at the right and 3rd toes   Renal: Patient does not have known baseline CKD. He is  on an ACEI/ARB at present.      3) Low testosterone :   - LH, FSH and prolactin normal, awaiting testosterone    4) Vitamin  D deficiency:  - Will refill ergocalciferol 50,000 iu weekly for 3 months but I explained to them that after this prescription is complete , he needs to  switch to OTC Vitamin D at 2000 iu daily      F/U in 4  months   Signed electronically by: Mack Guise, MD  [facility]  [facility] [address] [place], [place] [address] Phone: [phone] FAX: [phone]   CC: Girtha Rm, NP-C [address] [address] Phone: [phone]  Fax: [phone]  Return to Endocrinology clinic as below: No future appointments.

## 2020-10-04 LAB — PROLACTIN: Prolactin: 8.2 ng/mL (ref 2.0–18.0)

## 2020-10-04 MED ORDER — VITAMIN D (ERGOCALCIFEROL) 1.25 MG (50000 UNIT) PO CAPS: 50000.0000 [IU] | ORAL_CAPSULE | ORAL | 0 refills | Status: DC

## 2020-10-10 LAB — TESTOSTERONE FREE MS/DIALYSIS
Free Testosterone, Serum: 43 pg/mL — ABNORMAL LOW
Testosterone, Serum (Total): 207 ng/dL — ABNORMAL LOW
Testosterone-% Free: 2.1 %

## 2020-10-19 ENCOUNTER — Encounter: Payer: Self-pay | Admitting: Internal Medicine

## 2020-10-30 ENCOUNTER — Encounter: Payer: Self-pay | Admitting: Internal Medicine

## 2020-10-31 MED ORDER — TRULICITY 3 MG/0.5ML ~~LOC~~ SOAJ
3.0000 mg | SUBCUTANEOUS | 1 refills | Status: DC
Start: 2020-10-31 — End: 2020-12-29

## 2020-12-09 ENCOUNTER — Other Ambulatory Visit: Payer: Self-pay | Admitting: Family Medicine

## 2020-12-09 DIAGNOSIS — E1159 Type 2 diabetes mellitus with other circulatory complications: Secondary | ICD-10-CM

## 2020-12-12 NOTE — Telephone Encounter (Signed)
Received refill request for atorvastatin and lisinopril pt. Last apt was 07/14/20 and has no future apt.

## 2020-12-27 ENCOUNTER — Encounter: Payer: Self-pay | Admitting: Internal Medicine

## 2020-12-27 DIAGNOSIS — E1142 Type 2 diabetes mellitus with diabetic polyneuropathy: Secondary | ICD-10-CM

## 2020-12-29 MED ORDER — TRULICITY 3 MG/0.5ML ~~LOC~~ SOAJ: 3.0000 mg | SUBCUTANEOUS | 1 refills | Status: DC

## 2020-12-29 MED ORDER — EMPAGLIFLOZIN 25 MG PO TABS: 25.0000 mg | ORAL_TABLET | Freq: Every day | ORAL | 1 refills | Status: DC

## 2021-02-01 ENCOUNTER — Encounter: Payer: Self-pay | Admitting: Internal Medicine

## 2021-02-13 ENCOUNTER — Ambulatory Visit: Payer: 59 | Admitting: Internal Medicine

## 2021-03-04 ENCOUNTER — Other Ambulatory Visit: Payer: Self-pay | Admitting: Family Medicine

## 2021-03-04 DIAGNOSIS — E1159 Type 2 diabetes mellitus with other circulatory complications: Secondary | ICD-10-CM

## 2021-03-04 DIAGNOSIS — I152 Hypertension secondary to endocrine disorders: Secondary | ICD-10-CM

## 2021-03-27 ENCOUNTER — Other Ambulatory Visit: Payer: Self-pay

## 2021-03-27 ENCOUNTER — Encounter: Payer: Self-pay | Admitting: Family Medicine

## 2021-03-27 ENCOUNTER — Ambulatory Visit (INDEPENDENT_AMBULATORY_CARE_PROVIDER_SITE_OTHER): Payer: 59 | Admitting: Family Medicine

## 2021-03-27 VITALS — BP 138/94 | HR 103 | Ht 72.0 in | Wt 315.6 lb

## 2021-03-27 DIAGNOSIS — E559 Vitamin D deficiency, unspecified: Secondary | ICD-10-CM

## 2021-03-27 DIAGNOSIS — E1159 Type 2 diabetes mellitus with other circulatory complications: Secondary | ICD-10-CM

## 2021-03-27 DIAGNOSIS — I2510 Atherosclerotic heart disease of native coronary artery without angina pectoris: Secondary | ICD-10-CM | POA: Diagnosis not present

## 2021-03-27 DIAGNOSIS — H539 Unspecified visual disturbance: Secondary | ICD-10-CM

## 2021-03-27 DIAGNOSIS — F17200 Nicotine dependence, unspecified, uncomplicated: Secondary | ICD-10-CM | POA: Diagnosis not present

## 2021-03-27 DIAGNOSIS — R519 Headache, unspecified: Secondary | ICD-10-CM

## 2021-03-27 DIAGNOSIS — I152 Hypertension secondary to endocrine disorders: Secondary | ICD-10-CM

## 2021-03-27 DIAGNOSIS — R350 Frequency of micturition: Secondary | ICD-10-CM

## 2021-03-27 DIAGNOSIS — E1165 Type 2 diabetes mellitus with hyperglycemia: Secondary | ICD-10-CM | POA: Diagnosis not present

## 2021-03-27 LAB — POCT URINALYSIS DIP (PROADVANTAGE DEVICE)
Bilirubin, UA: NEGATIVE
Blood, UA: NEGATIVE
Glucose, UA: >=1000 mg/dL — AB
Ketones, POC UA: NEGATIVE mg/dL
Leukocytes, UA: NEGATIVE
Nitrite, UA: NEGATIVE
Protein Ur, POC: NEGATIVE mg/dL
Specific Gravity, Urine: 1.025
Urobilinogen, Ur: 0.2
pH, UA: 6 (ref 5.0–8.0)

## 2021-03-27 NOTE — Progress Notes (Signed)
   Subjective:    Patient ID: Tim Allen, male    DOB: December 05, 1975, 45 y.o.   MRN: [id number]  HPI Chief Complaint  Patient presents with   Hypertension   Hyperlipidemia   He is here to follow-up on chronic health conditions. States he did not take his medications for a couple of months while his wife was out of town.  He started back on medications approximately 2 weeks ago.  Has not been checking his blood pressure or blood sugar at home. He sees Dr. Kelton Pillar for for diabetes  States he has been having blurred vision and has never had a diabetic eye exam. He also reports urinary frequency.  States he has been having intermittent headaches  States he is back to smoking 2 packs of cigarettes per day.  States his diet has been poor.  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  Denies fever, chills, dizziness, chest pain, palpitations, shortness of breath, abdominal pain, N/V/D, LE edema.   Reviewed allergies, medications, past medical, surgical, family, and social history.    Review of Systems Pertinent positives and negatives in the history of present illness.     Objective:   Physical Exam BP (!) 138/94 (BP Location: Right Arm, Patient Position: Sitting)   Pulse (!) 103   Ht 6' (1.829 m)   Wt (!) 315 lb 9.6 oz (143.2 kg)   SpO2 94%   BMI 42.80 kg/m   Alert and oriented in no acute distress.  Respirations unlabored.       Assessment & Plan:  Hypertension associated with diabetes ([place]) - Plan: CBC with Differential/Platelet, Comprehensive metabolic panel -Discussed that his blood pressure is not controlled.  He has only been back on his hypertension medications for the past 2 weeks.  Advised that he may need more medication if his blood pressures are not responding.  Encourage low-sodium diet and cutting back on smoking.  Uncontrolled type 2 diabetes mellitus with hyperglycemia ([place]) - Plan: CBC with Differential/Platelet, Comprehensive  metabolic panel, POCT Urinalysis DIP (Proadvantage Device), TSH, T4, free, Lipid panel, Ambulatory referral to Ophthalmology -He will follow-up with his endocrinologist.  I will check labs.  [facility]erral to [facility] for his eye exam due to blurred vision and he is overdue.  ASCVD (arteriosclerotic cardiovascular disease) - Plan: Lipid panel -Continue statin therapy  Heavy smoker -He is smoking 2 packs/day again.  Advised consequences of smoking and I recommend he cut back  Acute nonintractable headache, unspecified headache type -This may be due to dehydration or vision changes.  He will keep an eye on his headaches and look for triggers.  Vision changes - Plan: Ambulatory referral to Ophthalmology -Referral to ophthalmologist for evaluation  Urinary frequency - Plan: POCT Urinalysis DIP (Proadvantage Device) -Urinalysis dipstick shows a large amount of glucose otherwise negative  Vitamin D deficiency - Plan: VITAMIN D 25 Hydroxy (Vit-D Deficiency, Fractures) -Follow-up pending vitamin D level  Morbid obesity ([place]) - Plan: TSH, T4, free, Lipid panel -Encouraged healthy diet and increasing physical activity

## 2021-03-27 NOTE — Patient Instructions (Signed)
I am referring you to [facility]. They will call you to schedule.   Follow up with Dr. Kelton Pillar.   We will be in touch with your lab results.

## 2021-03-28 LAB — CBC WITH DIFFERENTIAL/PLATELET
Basophils Absolute: 0.1 10*3/uL (ref 0.0–0.2)
Basos: 1 %
EOS (ABSOLUTE): 0.1 10*3/uL (ref 0.0–0.4)
Eos: 1 %
Hematocrit: 49.3 % (ref 37.5–51.0)
Hemoglobin: 16.7 g/dL (ref 13.0–17.7)
Immature Grans (Abs): 0 10*3/uL (ref 0.0–0.1)
Immature Granulocytes: 0 %
Lymphocytes Absolute: 4 10*3/uL — ABNORMAL HIGH (ref 0.7–3.1)
Lymphs: 41 %
MCH: 30 pg (ref 26.6–33.0)
MCHC: 33.9 g/dL (ref 31.5–35.7)
MCV: 89 fL (ref 79–97)
Monocytes Absolute: 0.8 10*3/uL (ref 0.1–0.9)
Monocytes: 9 %
Neutrophils Absolute: 4.8 10*3/uL (ref 1.4–7.0)
Neutrophils: 48 %
Platelets: 255 10*3/uL (ref 150–450)
RBC: 5.56 x10E6/uL (ref 4.14–5.80)
RDW: 13.6 % (ref 11.6–15.4)
WBC: 9.9 10*3/uL (ref 3.4–10.8)

## 2021-03-28 LAB — LIPID PANEL
Chol/HDL Ratio: 2.8 ratio (ref 0.0–5.0)
Cholesterol, Total: 92 mg/dL — ABNORMAL LOW (ref 100–199)
HDL: 33 mg/dL — ABNORMAL LOW (ref >39)
LDL Chol Calc (NIH): 37 mg/dL (ref 0–99)
Triglycerides: 118 mg/dL (ref 0–149)
VLDL Cholesterol Cal: 22 mg/dL (ref 5–40)

## 2021-03-28 LAB — COMPREHENSIVE METABOLIC PANEL
ALT: 49 IU/L — ABNORMAL HIGH (ref 0–44)
AST: 41 IU/L — ABNORMAL HIGH (ref 0–40)
Albumin/Globulin Ratio: 2 (ref 1.2–2.2)
Albumin: 4.7 g/dL (ref 4.0–5.0)
Alkaline Phosphatase: 74 IU/L (ref 44–121)
BUN/Creatinine Ratio: 11 (ref 9–20)
BUN: 8 mg/dL (ref 6–24)
Bilirubin Total: 0.3 mg/dL (ref 0.0–1.2)
CO2: 24 mmol/L (ref 20–29)
Calcium: 9.5 mg/dL (ref 8.7–10.2)
Chloride: 99 mmol/L (ref 96–106)
Creatinine, Ser: 0.75 mg/dL — ABNORMAL LOW (ref 0.76–1.27)
Globulin, Total: 2.4 g/dL (ref 1.5–4.5)
Glucose: 113 mg/dL — ABNORMAL HIGH (ref 70–99)
Potassium: 4 mmol/L (ref 3.5–5.2)
Sodium: 138 mmol/L (ref 134–144)
Total Protein: 7.1 g/dL (ref 6.0–8.5)
eGFR: 113 mL/min/{1.73_m2} (ref >59)

## 2021-03-28 LAB — TSH: TSH: 1.31 u[IU]/mL (ref 0.450–4.500)

## 2021-03-28 LAB — T4, FREE: Free T4: 1.18 ng/dL (ref 0.82–1.77)

## 2021-03-28 LAB — VITAMIN D 25 HYDROXY (VIT D DEFICIENCY, FRACTURES): Vit D, 25-Hydroxy: 25.1 ng/mL — ABNORMAL LOW (ref 30.0–100.0)

## 2021-04-02 ENCOUNTER — Other Ambulatory Visit: Payer: Self-pay | Admitting: Family Medicine

## 2021-04-02 DIAGNOSIS — I152 Hypertension secondary to endocrine disorders: Secondary | ICD-10-CM

## 2021-04-02 DIAGNOSIS — E1159 Type 2 diabetes mellitus with other circulatory complications: Secondary | ICD-10-CM

## 2021-06-15 ENCOUNTER — Other Ambulatory Visit: Payer: Self-pay

## 2021-06-15 ENCOUNTER — Encounter: Payer: Self-pay | Admitting: Internal Medicine

## 2021-06-15 DIAGNOSIS — E1142 Type 2 diabetes mellitus with diabetic polyneuropathy: Secondary | ICD-10-CM

## 2021-06-15 MED ORDER — TRULICITY 3 MG/0.5ML ~~LOC~~ SOAJ: 3.0000 mg | SUBCUTANEOUS | 0 refills | Status: DC

## 2021-08-16 ENCOUNTER — Telehealth: Payer: Self-pay | Admitting: Pharmacy Technician

## 2021-08-25 ENCOUNTER — Other Ambulatory Visit: Payer: Self-pay | Admitting: Internal Medicine

## 2021-08-25 DIAGNOSIS — E1142 Type 2 diabetes mellitus with diabetic polyneuropathy: Secondary | ICD-10-CM

## 2021-08-25 NOTE — Telephone Encounter (Signed)
Patient Advocate Encounter ?  ?Received notification that prior authorization for Jardiance 3m tabs is required by his/her insurance [employer]. ? ?Faxed PA form & chart notes to: [phone] 08/25/21 ? ?Status is pending ?   ?[facility] will continue to follow: ? ?Patient Advocate ?Fax: [phone] ?

## 2021-08-28 ENCOUNTER — Other Ambulatory Visit (HOSPITAL_COMMUNITY): Payer: Self-pay

## 2021-08-28 ENCOUNTER — Other Ambulatory Visit: Payer: Self-pay

## 2021-08-28 ENCOUNTER — Telehealth: Payer: Self-pay

## 2021-08-28 DIAGNOSIS — E1159 Type 2 diabetes mellitus with other circulatory complications: Secondary | ICD-10-CM

## 2021-08-28 DIAGNOSIS — I152 Hypertension secondary to endocrine disorders: Secondary | ICD-10-CM

## 2021-08-28 MED ORDER — ATORVASTATIN CALCIUM 20 MG PO TABS: 20.0000 mg | ORAL_TABLET | Freq: Every day | ORAL | 1 refills | Status: DC

## 2021-08-28 MED ORDER — LISINOPRIL 10 MG PO TABS: 10.0000 mg | ORAL_TABLET | Freq: Every day | ORAL | 1 refills | Status: DC

## 2021-08-28 NOTE — Telephone Encounter (Signed)
Patient Advocate Encounter ?  ?Received notification from [facility] that prior authorization for Trulicity 9KF/2.7MD pen injectors is required by his/her insurance [employer]. ?  ?PA submitted on 08/28/21 ? ?Key#: [id number] ? ?Status is pending ?   ?[facility] will continue to follow: ? ?Patient Advocate ?Fax: [phone]  ?

## 2021-08-30 ENCOUNTER — Other Ambulatory Visit (HOSPITAL_COMMUNITY): Payer: Self-pay

## 2021-08-31 ENCOUNTER — Other Ambulatory Visit (HOSPITAL_COMMUNITY): Payer: Self-pay

## 2021-08-31 NOTE — Telephone Encounter (Signed)
Patient Advocate Encounter ? ?Prior Authorization for Jardiance 40m tabs has been approved.   ? ?PA# [id number]? ?Effective dates: 08/25/21 through 08/26/22 ? ?Refill too soon ? ?Patient Advocate ?Fax: [phone] ?

## 2021-08-31 NOTE — Telephone Encounter (Signed)
Patient Advocate Encounter ? ?Prior Authorization for Trulicity 2TK/2.4EC pen injectors has been approved.   ? ?PA# [id number] ? ?Effective dates: 08/30/21 through 08/31/22 ? ?Per Test Claim Patients co-pay is $25 (2/28 days supply)  ? ?Spoke with Pharmacy to Process. ? ?Patient Advocate ?Fax: [phone]  ?

## 2021-09-20 ENCOUNTER — Other Ambulatory Visit: Payer: Self-pay | Admitting: Internal Medicine

## 2021-09-20 DIAGNOSIS — E1142 Type 2 diabetes mellitus with diabetic polyneuropathy: Secondary | ICD-10-CM

## 2021-09-20 MED ORDER — TRULICITY 3 MG/0.5ML ~~LOC~~ SOAJ: 3.0000 mg | SUBCUTANEOUS | 0 refills | Status: DC

## 2021-10-12 NOTE — Progress Notes (Signed)
? ?Complete physical exam ? ? ?Patient: Tim Allen   DOB: 1976-06-25   46 y.o. Male  MRN: [id number] ?Visit Date: 10/13/2021 ? ?Chief Complaint  ?Patient presents with  ? Annual Exam  ?  Non fasting cpe- no other concerns.  ? ?Subjective  ?  ?Tim Allen is a 46 y.o. male who presents today for a complete physical exam.  ? ?Reports is generally feeling well; is eating a regular diet; drinks minimal water a day; is not exercising ? ? ?HPI ?HPI   ? ? Annual Exam   ? Additional comments: Non fasting cpe- no other concerns. ? ?  ?  ?Last edited by Deforest Hoyles, [place] on 10/13/2021  2:15 PM.  ?  ?  ? ? ?Past Medical History:  ?Diagnosis Date  ? BMI 45.0-49.9, adult ([place]) 09/28/2014  ? Heavy cigarette smoker   ? Morbid obesity ([place])   ? Sleep apnea   ? Type 2 diabetes mellitus ([place])   ? new diagnosis with HgB A1c 13.9% on 08/27/16  ? Vitamin D deficiency 06/16/2018  ? ?Past Surgical History:  ?Procedure Laterality Date  ? KNEE SURGERY Right age 19  ? reconstruction after traumatic injury  ? ?Social History  ? ?Socioeconomic History  ? Marital status: Married  ?  Spouse name: Not on file  ? Number of children: 3  ? Years of education: Not on file  ? Highest education level: Not on file  ?Occupational History  ? Occupation: truck Geophysicist/field seismologist  ?  Comment: furniture delivery  ?Tobacco Use  ? Smoking status: Every Day  ?  Packs/day: 2.00  ?  Years: 10.00  ?  Pack years: 20.00  ?  Types: Cigarettes  ? Smokeless tobacco: Never  ?Substance and Sexual Activity  ? Alcohol use: Yes  ?  Alcohol/week: 0.0 standard drinks  ? Drug use: No  ? Sexual activity: Yes  ?Other Topics Concern  ? Not on file  ?Social History Narrative  ? Lives with his wife and 3 sons.  ? ?Social Determinants of Health  ? ?Financial Resource Strain: Not on file  ?Food Insecurity: Not on file  ?Transportation Needs: Not on file  ?Physical Activity: Not on file  ?Stress: Not on file  ?Social Connections: Not on file  ?Intimate Partner Violence: Not on file   ? ?Family Status  ?Relation Name Status  ? Mother  Alive  ? Father  Deceased  ? Brother  Alive  ? Brother  Alive  ? Son  Alive  ? Son  Alive  ? Son Harrell Lark  ? MGM  Deceased  ? MGF  Deceased  ? PGM  Deceased  ? PGF  Deceased  ? Neg Hx  (Not Specified)  ? ?Family History  ?Problem Relation Age of Onset  ? Hypertension Mother   ? Hyperlipidemia Mother   ? Diabetes Father   ?     sepsis. died at 82  ? Hypertension Father   ? Heart failure Father   ? Sickle cell trait Son   ? Mental illness Son   ? CVA Neg Hx   ? Prostate cancer Neg Hx   ? Breast cancer Neg Hx   ? ?No Known Allergies  ?Patient Care Team: ?Marcellina Millin as PCP - General (Physician Assistant) ?Shamleffer, Melanie Crazier, MD as Attending Physician (Endocrinology)  ? ?Medications: ?Outpatient Medications Prior to Visit  ?Medication Sig  ? Blood Glucose Monitoring Suppl (ONETOUCH VERIO) w/Device KIT 1 kit by Does  not apply route daily.  ? Dulaglutide (TRULICITY) 3 FX/5.8IT SOPN Inject 3 mg as directed once a week.  ? glucose blood (ONETOUCH VERIO) test strip Use as instructed to test blood sugar 2 times daily E11.65  ? ibuprofen (ADVIL) 800 MG tablet Take 1 tablet (800 mg total) by mouth 3 (three) times daily.  ? JARDIANCE 25 MG TABS tablet TAKE 1 TABLET BY MOUTH ONCE DAILY BEFORE BREAKFAST  ? Lancets (ONETOUCH ULTRASOFT) lancets Use as instructed to test blood sugar 2 times daily E11.65  ? metFORMIN (GLUCOPHAGE-XR) 500 MG 24 hr tablet Take 4 tablets (2,000 mg total) by mouth daily with breakfast.  ? Vitamin D, Ergocalciferol, (DRISDOL) 1.25 MG (50000 UNIT) CAPS capsule Take 1 capsule (50,000 Units total) by mouth every 7 (seven) days.  ? [DISCONTINUED] atorvastatin (LIPITOR) 20 MG tablet Take 1 tablet (20 mg total) by mouth daily.  ? [DISCONTINUED] lisinopril (ZESTRIL) 10 MG tablet Take 1 tablet (10 mg total) by mouth daily.  ? ?No facility-administered medications prior to visit.  ? ? ?Review of Systems  ?Constitutional:  Negative for  activity change and fever.  ?HENT:  Negative for congestion, ear pain and voice change.   ?Eyes:  Negative for redness.  ?Respiratory:  Negative for cough.   ?Cardiovascular:  Negative for chest pain.  ?Gastrointestinal:  Negative for constipation and diarrhea.  ?Endocrine: Negative for polyuria.  ?Genitourinary:  Negative for flank pain.  ?Musculoskeletal:  Negative for gait problem and neck stiffness.  ?Skin:  Negative for color change and rash.  ?Neurological:  Negative for dizziness.  ?Hematological:  Negative for adenopathy.  ?Psychiatric/Behavioral:  Negative for agitation, behavioral problems and confusion.   ? ?Last CBC ?Lab Results  ?Component Value Date  ? WBC 9.9 03/27/2021  ? HGB 16.7 03/27/2021  ? HCT 49.3 03/27/2021  ? MCV 89 03/27/2021  ? MCH 30.0 03/27/2021  ? RDW 13.6 03/27/2021  ? PLT 255 03/27/2021  ? ?Last metabolic panel ?Lab Results  ?Component Value Date  ? GLUCOSE 185 (H) 10/13/2021  ? NA 149 (H) 10/13/2021  ? K 4.2 10/13/2021  ? CL 108 (H) 10/13/2021  ? CO2 19 (L) 10/13/2021  ? BUN 9 10/13/2021  ? CREATININE 0.88 10/13/2021  ? EGFR 107 10/13/2021  ? CALCIUM 9.6 10/13/2021  ? PROT 7.1 10/13/2021  ? ALBUMIN 4.5 10/13/2021  ? LABGLOB 2.6 10/13/2021  ? AGRATIO 1.7 10/13/2021  ? BILITOT 0.2 10/13/2021  ? ALKPHOS 69 10/13/2021  ? AST 20 10/13/2021  ? ALT 34 10/13/2021  ? ANIONGAP 6 11/11/2016  ? ?Last lipids ?Lab Results  ?Component Value Date  ? CHOL 92 (L) 03/27/2021  ? HDL 33 (L) 03/27/2021  ? LDLCALC 37 03/27/2021  ? TRIG 118 03/27/2021  ? CHOLHDL 2.8 03/27/2021  ? ?Last hemoglobin A1c ?Lab Results  ?Component Value Date  ? HGBA1C 9.1 (H) 10/13/2021  ? ?Last thyroid functions ?Lab Results  ?Component Value Date  ? TSH 1.310 03/27/2021  ? T3TOTAL 169 10/05/2019  ? ?  ? ?The ASCVD Risk score (Arnett DK, et al., 2019) failed to calculate for the following reasons: ?  The valid total cholesterol range is 130 to 320 mg/dL ? ? Objective  ?  ?BP (!) 144/88   Pulse (!) 105   Ht 6' (1.829 m)   Wt  (!) 322 lb (146.1 kg)   SpO2 93%   BMI 43.67 kg/m?  ? ?  ? ? ?Physical Exam ?Vitals and nursing note reviewed.  ?Constitutional:   ?  General: He is not in acute distress. ?   Appearance: Normal appearance.  ?HENT:  ?   Head: Normocephalic and atraumatic.  ?   Right Ear: Tympanic membrane, ear canal and external ear normal.  ?   Left Ear: Tympanic membrane, ear canal and external ear normal.  ?   Nose: No congestion.  ?Eyes:  ?   Extraocular Movements: Extraocular movements intact.  ?   Conjunctiva/sclera: Conjunctivae normal.  ?   Pupils: Pupils are equal, round, and reactive to light.  ?Neck:  ?   Vascular: No carotid bruit.  ?Cardiovascular:  ?   Rate and Rhythm: Normal rate and regular rhythm.  ?   Pulses: Normal pulses.  ?   Heart sounds: Normal heart sounds.  ?Pulmonary:  ?   Effort: Pulmonary effort is normal.  ?   Breath sounds: Normal breath sounds. No wheezing.  ?Abdominal:  ?   General: Bowel sounds are normal.  ?   Palpations: Abdomen is soft.  ?Musculoskeletal:     ?   General: Normal range of motion.  ?   Cervical back: Normal range of motion and neck supple.  ?   Right lower leg: No edema.  ?   Left lower leg: No edema.  ?Skin: ?   General: Skin is warm and dry.  ?   Findings: No rash.  ?Neurological:  ?   Mental Status: He is alert and oriented to person, place, and time.  ?   Gait: Gait normal.  ?Psychiatric:     ?   Mood and Affect: Mood normal.     ?   Behavior: Behavior normal.  ?  ? ? ?Last depression screening scores ? ?  10/13/2021  ?  2:18 PM 10/05/2019  ?  8:20 AM 06/04/2018  ?  9:08 AM  ?PHQ 2/9 Scores  ?PHQ - 2 Score 0 0 3  ?PHQ- 9 Score   9  ? ?Last fall risk screening ? ?  10/13/2021  ?  2:18 PM  ?Fall Risk   ?Falls in the past year? 0  ?Number falls in past yr: 0  ?Injury with Fall? 0  ?Risk for fall due to : No Fall Risks  ?Follow up Falls evaluation completed  ? ? ? ?Results for orders placed or performed in visit on 10/13/21  ?Comprehensive metabolic panel  ?Result Value Ref Range  ?  Glucose 185 (H) 70 - 99 mg/dL  ? BUN 9 6 - 24 mg/dL  ? Creatinine, Ser 0.88 0.76 - 1.27 mg/dL  ? eGFR 107 >59 mL/min/1.73  ? BUN/Creatinine Ratio 10 9 - 20  ? Sodium 149 (H) 134 - 144 mmol/L  ? Potassium 4.2 3.5 - 5.2 mmo

## 2021-10-13 ENCOUNTER — Encounter: Payer: Self-pay | Admitting: Physician Assistant

## 2021-10-13 ENCOUNTER — Ambulatory Visit (INDEPENDENT_AMBULATORY_CARE_PROVIDER_SITE_OTHER): Payer: 59 | Admitting: Physician Assistant

## 2021-10-13 VITALS — BP 144/88 | HR 105 | Ht 72.0 in | Wt 322.0 lb

## 2021-10-13 DIAGNOSIS — E1159 Type 2 diabetes mellitus with other circulatory complications: Secondary | ICD-10-CM | POA: Diagnosis not present

## 2021-10-13 DIAGNOSIS — R7989 Other specified abnormal findings of blood chemistry: Secondary | ICD-10-CM

## 2021-10-13 DIAGNOSIS — J309 Allergic rhinitis, unspecified: Secondary | ICD-10-CM | POA: Diagnosis not present

## 2021-10-13 DIAGNOSIS — Z1211 Encounter for screening for malignant neoplasm of colon: Secondary | ICD-10-CM

## 2021-10-13 DIAGNOSIS — E661 Drug-induced obesity: Secondary | ICD-10-CM | POA: Diagnosis not present

## 2021-10-13 DIAGNOSIS — E1169 Type 2 diabetes mellitus with other specified complication: Secondary | ICD-10-CM | POA: Insufficient documentation

## 2021-10-13 DIAGNOSIS — Z6841 Body Mass Index (BMI) 40.0 and over, adult: Secondary | ICD-10-CM

## 2021-10-13 DIAGNOSIS — E782 Mixed hyperlipidemia: Secondary | ICD-10-CM | POA: Insufficient documentation

## 2021-10-13 DIAGNOSIS — Z1159 Encounter for screening for other viral diseases: Secondary | ICD-10-CM | POA: Diagnosis not present

## 2021-10-13 DIAGNOSIS — Z0000 Encounter for general adult medical examination without abnormal findings: Secondary | ICD-10-CM | POA: Diagnosis not present

## 2021-10-13 DIAGNOSIS — I152 Hypertension secondary to endocrine disorders: Secondary | ICD-10-CM | POA: Insufficient documentation

## 2021-10-13 DIAGNOSIS — E1142 Type 2 diabetes mellitus with diabetic polyneuropathy: Secondary | ICD-10-CM

## 2021-10-13 MED ORDER — LISINOPRIL 20 MG PO TABS: 20.0000 mg | ORAL_TABLET | Freq: Every day | ORAL | 1 refills | Status: DC

## 2021-10-13 MED ORDER — ATORVASTATIN CALCIUM 20 MG PO TABS: 20.0000 mg | ORAL_TABLET | Freq: Every day | ORAL | 1 refills | Status: DC

## 2021-10-13 NOTE — Patient Instructions (Addendum)
Preventative Care for Adults - Male ?   ?  MAINTAIN REGULAR HEALTH EXAMS: ?A routine yearly physical is a good way to check in with your primary care provider about your health and preventive screening. It is also an opportunity to share updates about your health and any concerns you have, and receive a thorough all-over exam.  ?Most health insurance companies pay for at least some preventative services.  Check with your health plan for specific coverages. ? ?WHAT PREVENTATIVE SERVICES DO WOMEN NEED? ?Adult women should have their weight and blood pressure checked regularly.  ?Women age 46 and older should have their cholesterol levels checked regularly. ?Women should be screened for cervical cancer with a Pap smear and pelvic exam beginning at either age 46, or 3 years after they become sexually activity.   ?Breast cancer screening generally begins at age 83 with a mammogram and breast exam by your primary care provider.   ?Beginning at age 46 and continuing to age 80, women should be screened for colorectal cancer.  Certain people may need continued testing until age 46. ?Updating vaccinations is part of preventative care.  Vaccinations help protect against diseases such as the flu. ?Osteoporosis is a disease in which the bones lose minerals and strength as we age. Women ages 46 and over should discuss this with their caregivers, as should women after menopause who have other risk factors. ?Lab tests are generally done as part of preventative care to screen for anemia and blood disorders, to screen for problems with the kidneys and liver, to screen for bladder problems, to check blood sugar, and to check your cholesterol level. ?Preventative services generally include counseling about diet, exercise, avoiding tobacco, drugs, excessive alcohol consumption, and sexually transmitted infections.   ? ?GENERAL RECOMMENDATIONS FOR GOOD HEALTH: ? ?Healthy diet: ?Eat a variety of foods, including fruit, vegetables,  animal or vegetable protein, such as meat, fish, chicken, and eggs, or beans, lentils, tofu, and grains, such as rice. ?Drink plenty of water daily (60 - 80 ounces or 8 - 10 glasses a day) ?Decrease saturated fat in the diet, avoid lots of red meat, processed foods, sweets, fast foods, and fried foods. For high cholesterol - Increase fiber intake (Benefiber or Metamucil, Cherrios,  oatmeal, beans, nuts, fruits and vegetables), limit saturated fats (in fried foods, red meat), can add OTC fish oil supplement, eat fish with Omega-3 fatty acids like salmon and tuna, exercise for 30 minutes 3 - 5 times a week, drink 8 - 10 glasses of water a day ? ?Exercise: ?Aerobic exercise helps maintain good heart health. At least 30-40 minutes of moderate-intensity exercise is recommended. For example, a brisk walk that increases your heart rate and breathing. This should be done on most days of the week.  ?Find a type of exercise or a variety of exercises that you enjoy so that it becomes a part of your daily life.  Examples are running, walking, swimming, water aerobics, and biking.  For motivation and support, explore group exercise such as aerobic class, spin class, Zumba, Yoga,or  martial arts, etc.   ?Set exercise goals for yourself, such as a certain weight goal, walk or run in a race such as a 5k walk/run.  Speak to your primary care provider about exercise goals. ? ?Disease prevention: ?If you smoke or chew tobacco, find out from your caregiver how to quit. It can literally save your life, no matter how long you have been a tobacco user. If you do not  use tobacco, never begin.  ?Maintain a healthy diet and normal weight. Increased weight leads to problems with blood pressure and diabetes.  ?The Body Mass Index or BMI is a way of measuring how much of your body is fat. Having a BMI above 27 increases the risk of heart disease, diabetes, hypertension, stroke and other problems related to obesity. Your caregiver can help  determine your BMI and based on it develop an exercise and dietary program to help you achieve or maintain this important measurement at a healthful level. ?High blood pressure causes heart and blood vessel problems.  Persistent high blood pressure should be treated with medicine if weight loss and exercise do not work.  ?Fat and cholesterol leaves deposits in your arteries that can block them. This causes heart disease and vessel disease elsewhere in your body.  If your cholesterol is found to be high, or if you have heart disease or certain other medical conditions, then you may need to have your cholesterol monitored frequently and be treated with medication.  ?Ask if you should have a cardiac stress test if your history suggests this. A stress test is a test done on a treadmill that looks for heart disease. This test can find disease prior to there being a problem. ?Menopause can be associated with physical symptoms and risks. Hormone replacement therapy is available to decrease these. You should talk to your caregiver about whether starting or continuing to take hormones is right for you.  ?Osteoporosis is a disease in which the bones lose minerals and strength as we age. This can result in serious bone fractures. Risk of osteoporosis can be identified using a bone density scan. Women ages 46 and over should discuss this with their caregivers, as should women after menopause who have other risk factors. Ask your caregiver whether you should be taking a calcium supplement and Vitamin D, to reduce the rate of osteoporosis.  ?Avoid drinking alcohol in excess (more than two drinks per day).  Avoid use of street drugs. Do not share needles with anyone. Ask for professional help if you need assistance or instructions on stopping the use of alcohol, cigarettes, and/or drugs. ?Brush your teeth twice a day with fluoride toothpaste, and floss once a day. Good oral hygiene prevents tooth decay and gum disease. The  problems can be painful, unattractive, and can cause other health problems. Visit your dentist for a routine oral and dental check up and preventive care every 6-12 months.  ?Look at your skin regularly.  Use a mirror to look at your back. Notify your caregivers of changes in moles, especially if there are changes in shapes, colors, a size larger than a pencil eraser, an irregular border, or development of new moles. ? ?Safety: ?Use seatbelts 100% of the time, whether driving or as a passenger.  Use safety devices such as hearing protection if you work in environments with loud noise or significant background noise.  Use safety glasses when doing any work that could send debris in to the eyes.  Use a helmet if you ride a bike or motorcycle.  Use appropriate safety gear for contact sports.  Talk to your caregiver about gun safety. ?Use sunscreen with a SPF (or skin protection factor) of 15 or greater.  Lighter skinned people are at a greater risk of skin cancer. Don?t forget to also wear sunglasses in order to protect your eyes from too much damaging sunlight. Damaging sunlight can accelerate cataract formation.  ?Practice safe sex. Use  condoms. Condoms are used for birth control and to help reduce the spread of sexually transmitted infections (or STIs).  Some of the STIs are gonorrhea (the clap), chlamydia, syphilis, trichomonas, herpes, HPV (human papilloma virus) and HIV (human immunodeficiency virus) which causes AIDS. The herpes, HIV and HPV are viral illnesses that have no cure. These can result in disability, cancer and death.  ?Keep carbon monoxide and smoke detectors in your home functioning at all times. Change the batteries every 6 months or use a model that plugs into the wall.  ? ?Vaccinations: ?Stay up to date with your tetanus shots and other required immunizations. You should have a booster for tetanus every 10 years. Be sure to get your flu shot every year, since 5%-20% of the [place] population comes  down with the flu. The flu vaccine changes each year, so being vaccinated once is not enough. Get your shot in the fall, before the flu season peaks.   ?Other vaccines to consider: ?Human Papilloma Virus or HPV causes

## 2021-10-15 DIAGNOSIS — E661 Drug-induced obesity: Secondary | ICD-10-CM | POA: Insufficient documentation

## 2021-10-15 NOTE — Assessment & Plan Note (Signed)
controlled, eat a low sugar diet, avoid starchy food with a lot of carbohydrates, avoid fried and processed foods; last hgb a1c 8.2 on 10/03/2020; hgb a1c 9.1 10/13/2021 ? ?

## 2021-10-15 NOTE — Assessment & Plan Note (Signed)
>>  ASSESSMENT AND PLAN FOR LOW TESTOSTERONE IN MALE WRITTEN ON 10/15/2021  9:50 PM BY WILLIAMS, LYNNE B, PA-C  Stable, testosterone levels drawn today, will monitor

## 2021-10-15 NOTE — Assessment & Plan Note (Signed)
Stable, OTC antihistamine, nasal steroid spray, and expectorant as needed; if worse can refer to Allergist for further evaluation and allergy testing ? ?

## 2021-10-15 NOTE — Assessment & Plan Note (Signed)
Uncontrolled, increase lisinopril 20 mg qd, eat a low salt diet, do not add any salt to food when cooking, avoid processed foods, avoid fried foods ? ?

## 2021-10-15 NOTE — Assessment & Plan Note (Signed)
Stable, drink 8 - 10 glasses of water daily, limit sugar intake and eat a low fat diet, exercise 3 - 5 days a week, example walking 1 - 2 miles  ? ?

## 2021-10-15 NOTE — Assessment & Plan Note (Signed)
Stable, testosterone levels drawn today, will monitor ?

## 2021-10-18 LAB — COMPREHENSIVE METABOLIC PANEL
ALT: 34 IU/L (ref 0–44)
AST: 20 IU/L (ref 0–40)
Albumin/Globulin Ratio: 1.7 (ref 1.2–2.2)
Albumin: 4.5 g/dL (ref 4.0–5.0)
Alkaline Phosphatase: 69 IU/L (ref 44–121)
BUN/Creatinine Ratio: 10 (ref 9–20)
BUN: 9 mg/dL (ref 6–24)
Bilirubin Total: 0.2 mg/dL (ref 0.0–1.2)
CO2: 19 mmol/L — ABNORMAL LOW (ref 20–29)
Calcium: 9.6 mg/dL (ref 8.7–10.2)
Chloride: 108 mmol/L — ABNORMAL HIGH (ref 96–106)
Creatinine, Ser: 0.88 mg/dL (ref 0.76–1.27)
Globulin, Total: 2.6 g/dL (ref 1.5–4.5)
Glucose: 185 mg/dL — ABNORMAL HIGH (ref 70–99)
Potassium: 4.2 mmol/L (ref 3.5–5.2)
Sodium: 149 mmol/L — ABNORMAL HIGH (ref 134–144)
Total Protein: 7.1 g/dL (ref 6.0–8.5)
eGFR: 107 mL/min/{1.73_m2} (ref >59)

## 2021-10-18 LAB — HEMOGLOBIN A1C
Est. average glucose Bld gHb Est-mCnc: 214 mg/dL
Hgb A1c MFr Bld: 9.1 % — ABNORMAL HIGH (ref 4.8–5.6)

## 2021-10-18 LAB — HEPATITIS C ANTIBODY: Hep C Virus Ab: NONREACTIVE

## 2021-10-18 LAB — TESTOSTERONE,FREE AND TOTAL
Testosterone, Free: 4.9 pg/mL — ABNORMAL LOW (ref 6.8–21.5)
Testosterone: 100 ng/dL — ABNORMAL LOW (ref 264–916)

## 2021-10-20 ENCOUNTER — Ambulatory Visit: Payer: 59 | Admitting: Internal Medicine

## 2021-10-20 ENCOUNTER — Encounter: Payer: Self-pay | Admitting: Internal Medicine

## 2021-10-20 VITALS — BP 140/80 | HR 95 | Ht 72.0 in | Wt 319.0 lb

## 2021-10-20 DIAGNOSIS — E785 Hyperlipidemia, unspecified: Secondary | ICD-10-CM

## 2021-10-20 DIAGNOSIS — R739 Hyperglycemia, unspecified: Secondary | ICD-10-CM

## 2021-10-20 DIAGNOSIS — E1165 Type 2 diabetes mellitus with hyperglycemia: Secondary | ICD-10-CM | POA: Diagnosis not present

## 2021-10-20 DIAGNOSIS — E1142 Type 2 diabetes mellitus with diabetic polyneuropathy: Secondary | ICD-10-CM

## 2021-10-20 LAB — POCT GLUCOSE (DEVICE FOR HOME USE): Glucose Fasting, POC: 128 mg/dL — AB (ref 70–99)

## 2021-10-20 MED ORDER — GLIPIZIDE 5 MG PO TABS: 5.0000 mg | ORAL_TABLET | Freq: Every day | ORAL | 3 refills | Status: DC

## 2021-10-20 MED ORDER — DAPAGLIFLOZIN PROPANEDIOL 10 MG PO TABS
10.0000 mg | ORAL_TABLET | Freq: Every day | ORAL | 3 refills | Status: DC
Start: 2021-10-20 — End: 2022-03-19

## 2021-10-20 MED ORDER — TRULICITY 3 MG/0.5ML ~~LOC~~ SOAJ: 3.0000 mg | SUBCUTANEOUS | 3 refills | Status: DC

## 2021-10-20 MED ORDER — METFORMIN HCL ER 500 MG PO TB24: 1000.0000 mg | ORAL_TABLET | Freq: Every day | ORAL | 3 refills | Status: DC

## 2021-10-20 NOTE — Progress Notes (Signed)
? ?Name: Tim Allen  ?Age/ Sex: 46 y.o., male   ?MRN/ DOB: [id number], [birth year]    ? ?PCP: Irene Pap, PA-C   ?Reason for Endocrinology Evaluation: Type 2 Diabetes Mellitus  ?Initial Endocrine Consultative Visit: 06/16/18  ? ? ?PATIENT IDENTIFIER: Tim Allen is a 46 y.o. male with a past medical history of HTN, Dyslipidemia,T2DM and OSA. The patient has followed with Endocrinology clinic since 06/16/18 for consultative assistance with management of his diabetes. ? ?DIABETIC HISTORY:  ?Tim Allen was diagnosed with T2DM in 08/2017, has been on oral glycemic agents since his diagnosis. His hemoglobin A1c has ranged from 7.1% in 08/2017, peaking at 13.9% in 08/2016. ? ? ?On his presentation he has been on Metformin XR , farxiga and trulicity.  ? ?SUBJECTIVE:  ? ?During the last visit (10/04/2019): A1c 8.2 %, continued metformin, Jardiance and Trulicity  ? ? ?Today (10/20/2021): Tim Allen is for a follow-up on his diabetes.He has not been to our clinic in 1 year. He is accompanied by his wife today.   He check his blood sugars 0 times daily . He is a Administrator.Otherwise, the patient has not required any recent emergency interventions for hypoglycemia  ? ?He initially stated that been taking Metformin 2 tabs instead of 4 tabs  due to GI side effects  ? ?He has not been on Jardiance due to cost  ? ?HOME DIABETES REGIMEN:  ?Metformin 500 mg XR 4 tabs daily  ?Trulicity 3 mg weekly ( Sundays) ?Jardiance 25 mg daily  ? ? ? ?METER DOWNLOAD SUMMARY: Does not check  ? ? ? ? ? ?DIABETIC COMPLICATIONS: ?Microvascular complications:  ? ?Denies: neuropathy , retinopathy , nephropathy ?Last eye exam: Completed years ago ?  ?Macrovascular complications:  ?  ?Denies: CAD, PVD, CVA ? ?HISTORY:  ?Past Medical History:  ?Past Medical History:  ?Diagnosis Date  ? BMI 45.0-49.9, adult ([place]) 09/28/2014  ? Heavy cigarette smoker   ? Morbid obesity ([place])   ? Sleep apnea   ? Type 2 diabetes mellitus ([place])   ? new diagnosis with  HgB A1c 13.9% on 08/27/16  ? Vitamin D deficiency 06/16/2018  ? ?Past Surgical History:  ?Past Surgical History:  ?Procedure Laterality Date  ? KNEE SURGERY Right age 41  ? reconstruction after traumatic injury  ? ?Social History:  reports that he has been smoking cigarettes. He has a 20.00 pack-year smoking history. He has never used smokeless tobacco. He reports current alcohol use. He reports that he does not use drugs. ?Family History:  ?Family History  ?Problem Relation Age of Onset  ? Hypertension Mother   ? Hyperlipidemia Mother   ? Diabetes Father   ?     sepsis. died at 69  ? Hypertension Father   ? Heart failure Father   ? Sickle cell trait Son   ? Mental illness Son   ? CVA Neg Hx   ? Prostate cancer Neg Hx   ? Breast cancer Neg Hx   ? ? ? ?HOME MEDICATIONS: ?Allergies as of 10/20/2021   ?No Known Allergies ?  ? ?  ?Medication List  ?  ? ?  ? Accurate as of October 20, 2021 12:06 PM. If you have any questions, ask your nurse or doctor.  ?  ?  ? ?  ? ?atorvastatin 20 MG tablet ?Commonly known as: LIPITOR ?Take 1 tablet (20 mg total) by mouth daily. ?  ?ibuprofen 800 MG tablet ?Commonly known as: ADVIL ?Take 1  tablet (800 mg total) by mouth 3 (three) times daily. ?  ?Jardiance 25 MG Tabs tablet ?Generic drug: empagliflozin ?TAKE 1 TABLET BY MOUTH ONCE DAILY BEFORE BREAKFAST ?  ?lisinopril 20 MG tablet ?Commonly known as: ZESTRIL ?Take 1 tablet (20 mg total) by mouth daily. ?  ?metFORMIN 500 MG 24 hr tablet ?Commonly known as: GLUCOPHAGE-XR ?Take 4 tablets (2,000 mg total) by mouth daily with breakfast. ?  ?onetouch ultrasoft lancets ?Use as instructed to test blood sugar 2 times daily E11.65 ?  ?OneTouch Verio test strip ?Generic drug: glucose blood ?Use as instructed to test blood sugar 2 times daily E11.65 ?  ?OneTouch Verio w/Device Kit ?1 kit by Does not apply route daily. ?  ?Trulicity 3 RC/7.8LF Sopn ?Generic drug: Dulaglutide ?Inject 3 mg as directed once a week. ?  ?Vitamin D (Ergocalciferol) 1.25 MG  (50000 UNIT) Caps capsule ?Commonly known as: DRISDOL ?Take 1 capsule (50,000 Units total) by mouth every 7 (seven) days. ?  ? ?  ? ? ? ?OBJECTIVE:  ? ?Vital Signs: BP 140/80 (BP Location: Left Arm, Patient Position: Sitting, Cuff Size: [place])   Pulse 95   Ht 6' (1.829 m)   Wt (!) 319 lb (144.7 kg)   SpO2 96%   BMI 43.26 kg/m?   ?Wt Readings from Last 3 Encounters:  ?10/20/21 (!) 319 lb (144.7 kg)  ?10/13/21 (!) 322 lb (146.1 kg)  ?03/27/21 (!) 315 lb 9.6 oz (143.2 kg)  ? ? ? ?Exam: ?General: Pt appears well and is in NAD  ?Neck: General: Supple without adenopathy. ?Thyroid: Thyroid size normal.  No goiter or nodules appreciated. No thyroid bruit.  ?Chest wall:  No evidence of gynecomastia  ?Lungs: Clear with good BS bilat with no rales, rhonchi, or wheezes  ?Heart: RRR with normal S1 and S2 and no gallops; no murmurs; no rub  ?Abdomen: Normoactive bowel sounds, soft, nontender.  ?Extremities: No pretibial edema.   ?Neuro: MS is good with appropriate affect, pt is alert and Ox3  ? ? ?DM foot exam:10/20/2021 ?The skin of the feet is intact without sores or ulcerations, long curved toe nails  ?The pedal pulses are 2+ on right and 2+ on left. ?The sensation is intact to a screening 5.07, 10 gram monofilament bilaterally ? ? ? ? ?DATA REVIEWED: ? ?Lab Results  ?Component Value Date  ? HGBA1C 9.1 (H) 10/13/2021  ? HGBA1C 8.2 (A) 10/03/2020  ? HGBA1C 8.4 (A) 04/04/2020  ? ? Latest Reference Range & Units 10/13/21 15:16  ?Sodium 134 - 144 mmol/L 149 (H)  ?Potassium 3.5 - 5.2 mmol/L 4.2  ?Chloride 96 - 106 mmol/L 108 (H)  ?CO2 20 - 29 mmol/L 19 (L)  ?Glucose 70 - 99 mg/dL 185 (H)  ?BUN 6 - 24 mg/dL 9  ?Creatinine 0.76 - 1.27 mg/dL 0.88  ?Calcium 8.7 - 10.2 mg/dL 9.6  ?BUN/Creatinine Ratio 9 - 20  10  ?eGFR >59 mL/min/1.73 107  ?Alkaline Phosphatase 44 - 121 IU/L 69  ?Albumin 4.0 - 5.0 g/dL 4.5  ?Albumin/Globulin Ratio 1.2 - 2.2  1.7  ?AST 0 - 40 IU/L 20  ?ALT 0 - 44 IU/L 34  ?Total Protein 6.0 - 8.5 g/dL 7.1  ?Total  Bilirubin 0.0 - 1.2 mg/dL 0.2  ? ?ASSESSMENT / PLAN / RECOMMENDATIONS:  ? ?1) Type 2 diabetes Mellitus, Poorly  controlled, With neuropathic complications - Most recent A1c of 9.1 %. Goal A1c <7.0 %. ? ?- A1c remains above goal  ?- Intolerant to 4 tablets  daily , will reduce to 2 tabs  ?- Will restart Trulicity  ?- Jardiance cost prohibitive, prescribed farxiga and provided coupons  ?- We discussed starting Glipizide, pt was advised to take it 15-20 minutes before first meal of the day  ? ?MEDICATIONS: ?-Decrease  Metformin 500 mg XR, 2 tablets daily  ?-Continue Trulicity to 3 mg weekly ( Sundays )  ?-Start  Farxiga  mg daily ?- Start Glipizide 5 mg daily  ? ? ?EDUCATION / INSTRUCTIONS: ?BG monitoring instructions: Patient is instructed to check his blood sugars 1 times a day ?Call [facility] if: BG persistently < 70  ?I reviewed the Rule of 15 for the treatment of hypoglycemia in detail with the patient. Literature supplied. ? ? ?2) Diabetic complications:  ?Eye: Unknown to have known diabetic retinopathy.  ?Neuro/ Feet: Does have known diabetic peripheral neuropathy. Based on loss of sensation at the right and 3rd toes  ?Renal: Patient does not have known baseline CKD. He is  on an ACEI/ARB at present. ?  ? ? ? ? ?3) Dyslipidemia : ? ? ?- LDL at goal  ? ?Medication  ?Continue Atorvastatin  ? ? ? ? ?F/U in 3 months ? ? ?Signed electronically by: ?Abby Nena Jordan, MD ? ?[facility]  ?[facility] ?[address] ?[place],  [address] ?Phone: [phone] ?FAX: [phone] ? ? ?CC: ?[facility]ell[facility] Millin ?[address] ?[place] [address] ?Phone: [phone]  ?Fax: [phone] ? ?Return to Endocrinology clinic as below: ?Future Appointments  ?Date Time Provider [place]  ?10/20/2021 12:10 PM Wells Mabe, Melanie Crazier, MD [facility] None  ?04/06/2022  9:45 AM Irene Pap, PA-C [facility]  ?  ? ? ?

## 2021-10-20 NOTE — Patient Instructions (Addendum)
-   Continue  Metformin to 500 mg XR 2 tablets daily  ?- Continue Trulicity 3  mg weekly  ?- Start Farxiga 10 mg daily  ?- Start Glipizide 5 mg daily  ? ? ? ?HOW TO TREAT LOW BLOOD SUGARS (Blood sugar LESS THAN 70 MG/DL) ?Please follow the RULE OF 15 for the treatment of hypoglycemia treatment (when your (blood sugars are less than 70 mg/dL)  ? ?STEP 1: Take 15 grams of carbohydrates when your blood sugar is low, which includes:  ?3-4 GLUCOSE TABS  OR ?3-4 OZ OF JUICE OR REGULAR SODA OR ?ONE TUBE OF GLUCOSE GEL   ? ?STEP 2: RECHECK blood sugar in 15 MINUTES ?STEP 3: If your blood sugar is still low at the 15 minute recheck --> then, go back to STEP 1 and treat AGAIN with another 15 grams of carbohydrates. ? ? ? ?

## 2021-10-24 ENCOUNTER — Other Ambulatory Visit (HOSPITAL_COMMUNITY): Payer: Self-pay

## 2021-10-24 ENCOUNTER — Telehealth: Payer: Self-pay | Admitting: Pharmacy Technician

## 2021-10-24 NOTE — Telephone Encounter (Signed)
Patient Advocate Encounter ? ?Received notification from [facility] that prior authorization for FARXIGA 10MG is required. ?  ?PA NOT NEEDED ?Key [id number] ?Test billing results returned a copay of $15 for 30 days. An eVoucher paid $121.53. ?Without eVoucher copay would be $136.53 which includes an unmet deductible of [phone]. ? ?Reginna Sermeno R Emmalin Jaquess, CPhT ?Patient Advocate ?[facility] ?Phone: [phone] ?Fax:  [phone] ? ?

## 2021-11-02 DIAGNOSIS — Z1211 Encounter for screening for malignant neoplasm of colon: Secondary | ICD-10-CM | POA: Diagnosis not present

## 2021-11-12 LAB — COLOGUARD: COLOGUARD: NEGATIVE

## 2021-12-12 IMAGING — DX DG FINGER MIDDLE 2+V*R*
3 series · 3 of 3 positions shown · non-contrast
Comparison: None.

CLINICAL DATA: 43-year-old male status post puncture wound from
nail on palmar aspect of phalanx 5 days ago. Recurrent bleeding.
Query foreign body.

EXAM:
RIGHT MIDDLE FINGER 2+V

[finger pa (1 of 2)]
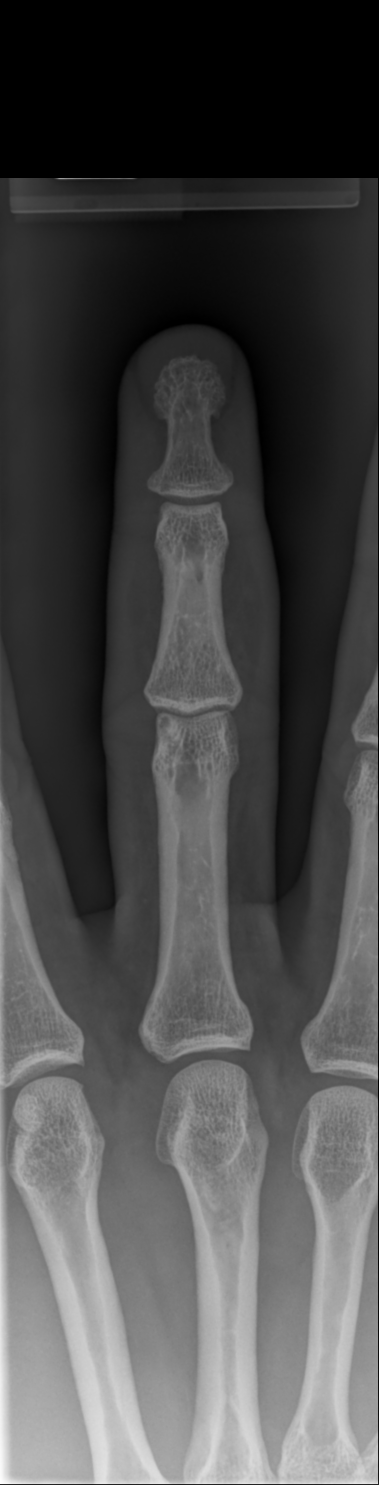

[finger pa (2 of 2)]
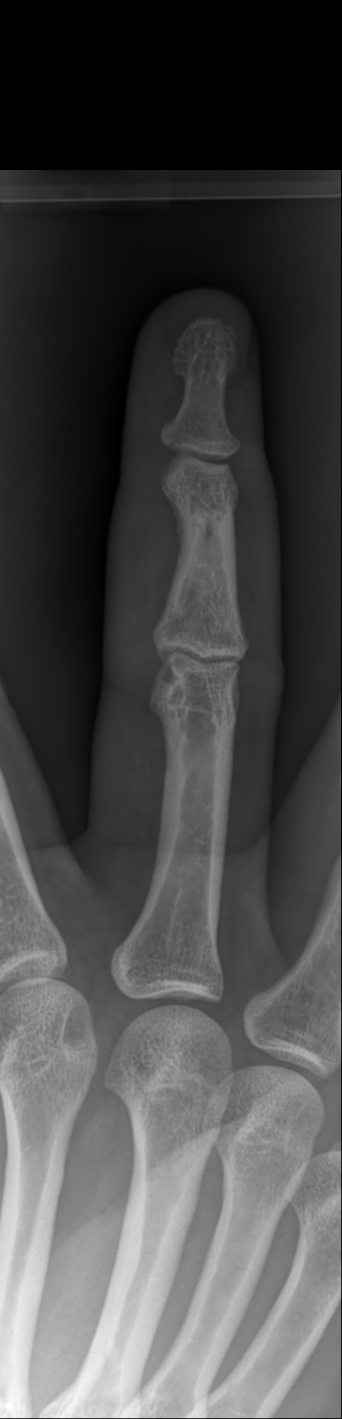

[finger lat]
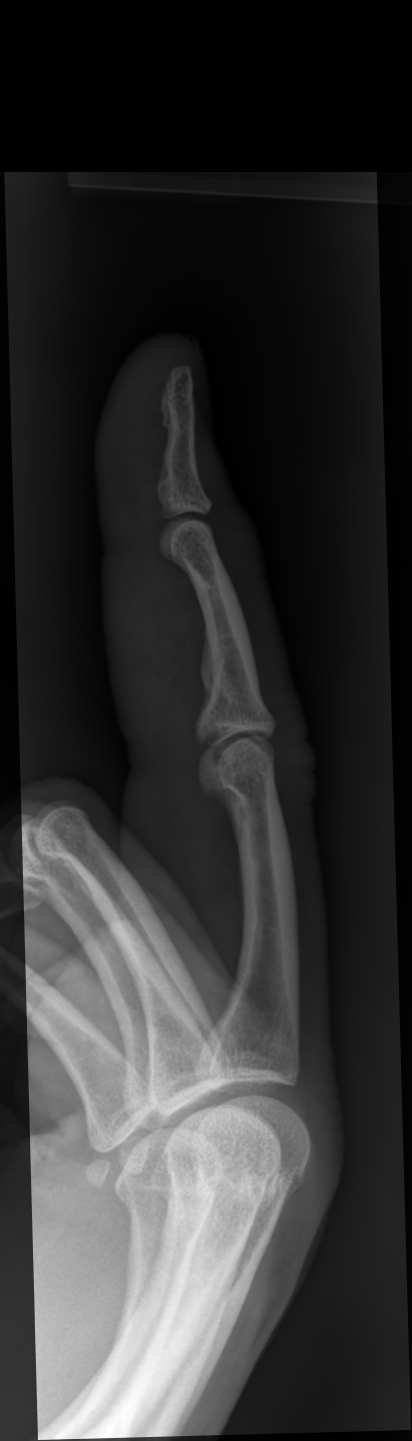

[3 of 3 positions shown; findings below may reference images not displayed]

FINDINGS: Generalized soft tissue swelling about the right 3rd finger. No soft
tissue gas. No radiopaque foreign body identified.

Underlying osseous structures and joint spaces appear within normal
limits. Normal background bone mineralization.
IMPRESSION: Right middle finger generalized soft tissue swelling with no
radiopaque foreign body or acute osseous abnormality identified.

## 2021-12-22 ENCOUNTER — Encounter: Payer: Self-pay | Admitting: Internal Medicine

## 2022-02-22 ENCOUNTER — Ambulatory Visit: Payer: 59 | Admitting: Internal Medicine

## 2022-02-23 ENCOUNTER — Ambulatory Visit: Payer: 59 | Admitting: Internal Medicine

## 2022-02-28 ENCOUNTER — Encounter: Payer: Self-pay | Admitting: Internal Medicine

## 2022-03-19 ENCOUNTER — Encounter: Payer: Self-pay | Admitting: Family Medicine

## 2022-03-19 ENCOUNTER — Other Ambulatory Visit: Payer: Self-pay

## 2022-03-19 ENCOUNTER — Encounter: Payer: Self-pay | Admitting: Internal Medicine

## 2022-03-19 MED ORDER — GLIPIZIDE 5 MG PO TABS
5.0000 mg | ORAL_TABLET | Freq: Every day | ORAL | 1 refills | Status: DC
Start: 2022-03-19 — End: 2022-04-02

## 2022-03-19 MED ORDER — DAPAGLIFLOZIN PROPANEDIOL 10 MG PO TABS: 10.0000 mg | ORAL_TABLET | Freq: Every day | ORAL | 1 refills | Status: DC

## 2022-03-19 MED ORDER — METFORMIN HCL ER 500 MG PO TB24: 1000.0000 mg | ORAL_TABLET | Freq: Every day | ORAL | 1 refills | Status: DC

## 2022-03-20 ENCOUNTER — Other Ambulatory Visit: Payer: Self-pay

## 2022-03-20 DIAGNOSIS — E1142 Type 2 diabetes mellitus with diabetic polyneuropathy: Secondary | ICD-10-CM

## 2022-03-20 MED ORDER — TRULICITY 3 MG/0.5ML ~~LOC~~ SOAJ: 3.0000 mg | SUBCUTANEOUS | 1 refills | Status: DC

## 2022-03-30 ENCOUNTER — Encounter: Payer: 59 | Admitting: Physician Assistant

## 2022-04-02 ENCOUNTER — Telehealth: Payer: Self-pay | Admitting: Physician Assistant

## 2022-04-02 ENCOUNTER — Ambulatory Visit (INDEPENDENT_AMBULATORY_CARE_PROVIDER_SITE_OTHER): Payer: 59 | Admitting: Internal Medicine

## 2022-04-02 ENCOUNTER — Encounter: Payer: Self-pay | Admitting: Internal Medicine

## 2022-04-02 ENCOUNTER — Other Ambulatory Visit: Payer: Self-pay | Admitting: *Deleted

## 2022-04-02 VITALS — BP 140/82 | HR 87 | Ht 72.0 in | Wt 314.0 lb

## 2022-04-02 DIAGNOSIS — R7989 Other specified abnormal findings of blood chemistry: Secondary | ICD-10-CM | POA: Diagnosis not present

## 2022-04-02 DIAGNOSIS — E1142 Type 2 diabetes mellitus with diabetic polyneuropathy: Secondary | ICD-10-CM

## 2022-04-02 DIAGNOSIS — E1165 Type 2 diabetes mellitus with hyperglycemia: Secondary | ICD-10-CM

## 2022-04-02 LAB — POCT GLYCOSYLATED HEMOGLOBIN (HGB A1C): Hemoglobin A1C: 9.2 % — AB (ref 4.0–5.6)

## 2022-04-02 LAB — POCT GLUCOSE (DEVICE FOR HOME USE): Glucose Fasting, POC: 226 mg/dL — AB (ref 70–99)

## 2022-04-02 MED ORDER — LISINOPRIL 20 MG PO TABS: 20.0000 mg | ORAL_TABLET | Freq: Every day | ORAL | 0 refills | Status: DC

## 2022-04-02 MED ORDER — TRULICITY 3 MG/0.5ML ~~LOC~~ SOAJ: 3.0000 mg | SUBCUTANEOUS | 3 refills | Status: DC

## 2022-04-02 MED ORDER — ATORVASTATIN CALCIUM 20 MG PO TABS: 20.0000 mg | ORAL_TABLET | Freq: Every day | ORAL | 0 refills | Status: DC

## 2022-04-02 MED ORDER — METFORMIN HCL ER 500 MG PO TB24: 1000.0000 mg | ORAL_TABLET | Freq: Every day | ORAL | 3 refills | Status: AC

## 2022-04-02 MED ORDER — DAPAGLIFLOZIN PROPANEDIOL 10 MG PO TABS: 10.0000 mg | ORAL_TABLET | Freq: Every day | ORAL | 3 refills | Status: DC

## 2022-04-02 MED ORDER — GLIPIZIDE ER 5 MG PO TB24: 5.0000 mg | ORAL_TABLET | Freq: Every day | ORAL | 3 refills | Status: AC

## 2022-04-02 MED ORDER — TESTOSTERONE CYPIONATE 200 MG/ML IJ SOLN: 100.0000 mg | INTRAMUSCULAR | 5 refills | Status: DC

## 2022-04-02 MED ORDER — "BD LUER-LOK SYRINGE 23G X 1"" 3 ML MISC": 1.0000 | 1 refills | Status: AC

## 2022-04-02 NOTE — Telephone Encounter (Signed)
Done

## 2022-04-02 NOTE — Patient Instructions (Addendum)
-   Continue  Metformin 500 mg XR 2 tablets daily  - Continue Trulicity 3  mg weekly  - Continue Farxiga 10 mg daily  - Change Glipizide 5 mg XL , 1 tablet daily before breakfast    Start Testosterone 100 mg (0.5 mL ) every 14 days     HOW TO TREAT LOW BLOOD SUGARS (Blood sugar LESS THAN 70 MG/DL) Please follow the RULE OF 15 for the treatment of hypoglycemia treatment (when your (blood sugars are less than 70 mg/dL)   STEP 1: Take 15 grams of carbohydrates when your blood sugar is low, which includes:  3-4 GLUCOSE TABS  OR 3-4 OZ OF JUICE OR REGULAR SODA OR ONE TUBE OF GLUCOSE GEL    STEP 2: RECHECK blood sugar in 15 MINUTES STEP 3: If your blood sugar is still low at the 15 minute recheck --> then, go back to STEP 1 and treat AGAIN with another 15 grams of carbohydrates.

## 2022-04-02 NOTE — Telephone Encounter (Signed)
Looks like pt sent in [place]  message 03/19/22 but it went to wrong provider/practice & wasn't handled. Looks like he is still pt here and had CPE 4/23, had another appt scheduled but we cancelled due to [place] [facility] being gone.  His message Please send all of my prescriptions for the following meds to: [facility]  [facility], [place], [place] [address] Phone: [phone] Fax: [phone]   Thank you in advance.

## 2022-04-02 NOTE — Progress Notes (Unsigned)
Name: Tim Allen  Age/ Sex: 46 y.o., male   MRN/ DOB: [id number], Sep 28, 1975     PCP: Marcellina Millin   Reason for Endocrinology Evaluation: Type 2 Diabetes Mellitus  Initial Endocrine Consultative Visit: 06/16/18    PATIENT IDENTIFIER: Tim Allen is a 46 y.o. male with a past medical history of HTN, Dyslipidemia,T2DM and OSA on CPAP . The patient has followed with Endocrinology clinic since 06/16/18 for consultative assistance with management of his diabetes.  DIABETIC HISTORY:  Mr. Varden was diagnosed with T2DM in 08/2017, has been on oral glycemic agents since his diagnosis. His hemoglobin A1c has ranged from 7.1% in 08/2017, peaking at 13.9% in 08/2016.   On his presentation he has been on Metformin XR , farxiga and trulicity.  Started glipizide in April 2023    HYPOGONADISM:  Patient with history of hypogonadism for years, he used to be on topical testosterone replacement therapy through his prior physician, but due to miscommunication regarding instructions he stopped using it   We started testosterone replacement therapy in October 2023 due to persistent low testosterone.  Patient opted with intramuscular injections   SUBJECTIVE:   During the last visit (10/20/2021): A1c 9.1%, continued metformin, Jardiance and Trulicity    Today (74/06/6382): Tim Allen is for a follow-up on his diabetes. He check his blood sugars 0 times daily . He is a Administrator.Otherwise, the patient has not required any recent emergency interventions for hypoglycemia   He continues with chronic diarrhea despite decreasing Metformin  He is not on Trulicity due to being out of stock and the cost $300     HOME DIABETES REGIMEN:  Metformin 500 mg XR 2 tabs daily  Trulicity 3 mg weekly ( Sundays) Farxiga 10 mg daily Glipizide 5 mg daily    METER DOWNLOAD SUMMARY: Does not check       DIABETIC COMPLICATIONS: Microvascular complications:   Denies: neuropathy ,  retinopathy , nephropathy Last eye exam: Completed years ago   Macrovascular complications:    Denies: CAD, PVD, CVA  HISTORY:  Past Medical History:  Past Medical History:  Diagnosis Date   BMI 45.0-49.9, adult ([place]) 09/28/2014   Heavy cigarette smoker    Morbid obesity ([place])    Sleep apnea    Type 2 diabetes mellitus ([place])    new diagnosis with HgB A1c 13.9% on 08/27/16   Vitamin D deficiency 06/16/2018   Past Surgical History:  Past Surgical History:  Procedure Laterality Date   KNEE SURGERY Right age 59   reconstruction after traumatic injury   Social History:  reports that he has been smoking cigarettes. He has a 20.00 pack-year smoking history. He has never used smokeless tobacco. He reports current alcohol use. He reports that he does not use drugs. Family History:  Family History  Problem Relation Age of Onset   Hypertension Mother    Hyperlipidemia Mother    Diabetes Father        sepsis. died at 27   Hypertension Father    Heart failure Father    Sickle cell trait Son    Mental illness Son    CVA Neg Hx    Prostate cancer Neg Hx    Breast cancer Neg Hx      HOME MEDICATIONS: Allergies as of 04/02/2022   No Known Allergies      Medication List        Accurate as of April 02, 2022 12:23 PM. If you have  any questions, ask your nurse or doctor.          STOP taking these medications    glipiZIDE 5 MG tablet Commonly known as: GLUCOTROL Replaced by: glipiZIDE 5 MG 24 hr tablet Stopped by: Dorita Sciara, MD       TAKE these medications    atorvastatin 20 MG tablet Commonly known as: LIPITOR Take 1 tablet (20 mg total) by mouth daily.   B-D 3CC LUER-LOK SYR 23GX1" 23G X 1" 3 ML Misc Generic drug: SYRINGE-NEEDLE (DISP) 3 ML 1 Device by Does not apply route every 14 (fourteen) days. Started by: Dorita Sciara, MD   dapagliflozin propanediol 10 MG Tabs tablet Commonly known as: Farxiga Take 1 tablet (10 mg total) by mouth  daily before breakfast.   glipiZIDE 5 MG 24 hr tablet Commonly known as: GLUCOTROL XL Take 1 tablet (5 mg total) by mouth daily with breakfast. Replaces: glipiZIDE 5 MG tablet Started by: Dorita Sciara, MD   ibuprofen 800 MG tablet Commonly known as: ADVIL Take 1 tablet (800 mg total) by mouth 3 (three) times daily.   lisinopril 20 MG tablet Commonly known as: ZESTRIL Take 1 tablet (20 mg total) by mouth daily.   metFORMIN 500 MG 24 hr tablet Commonly known as: GLUCOPHAGE-XR Take 2 tablets (1,000 mg total) by mouth daily with breakfast.   onetouch ultrasoft lancets Use as instructed to test blood sugar 2 times daily E11.65   OneTouch Verio test strip Generic drug: glucose blood Use as instructed to test blood sugar 2 times daily E11.65   OneTouch Verio w/Device Kit 1 kit by Does not apply route daily.   Testosterone Cypionate 200 MG/ML Soln Inject 100 mg as directed every 14 (fourteen) days. Started by: Dorita Sciara, MD   Trulicity 3 QI/6.9GE Sopn Generic drug: Dulaglutide Inject 3 mg as directed once a week.   Vitamin D (Ergocalciferol) 1.25 MG (50000 UNIT) Caps capsule Commonly known as: DRISDOL Take 1 capsule (50,000 Units total) by mouth every 7 (seven) days.         OBJECTIVE:   Vital Signs: BP (!) 140/82 (BP Location: Left Arm, Patient Position: Sitting, Cuff Size: Large)   Pulse 87   Ht 6' (1.829 m)   Wt (!) 314 lb (142.4 kg)   SpO2 93%   BMI 42.59 kg/m   Wt Readings from Last 3 Encounters:  04/02/22 (!) 314 lb (142.4 kg)  10/20/21 (!) 319 lb (144.7 kg)  10/13/21 (!) 322 lb (146.1 kg)     Exam: General: Pt appears well and is in NAD  Neck: General: Supple without adenopathy. Thyroid: Thyroid size normal.  No goiter or nodules appreciated. No thyroid bruit.  Lungs: Clear with good BS bilat   Heart: RRR   Abdomen: Normoactive bowel sounds, soft, nontender.  Extremities: No pretibial edema.   Neuro: MS is good with appropriate  affect, pt is alert and Ox3    DM foot exam:10/20/2021 The skin of the feet is intact without sores or ulcerations, long curved toe nails  The pedal pulses are 2+ on right and 2+ on left. The sensation is intact to a screening 5.07, 10 gram monofilament bilaterally     DATA REVIEWED:  Lab Results  Component Value Date   HGBA1C 9.2 (A) 04/02/2022   HGBA1C 9.1 (H) 10/13/2021   HGBA1C 8.2 (A) 10/03/2020    ASSESSMENT / PLAN / RECOMMENDATIONS:   1) Type 2 diabetes Mellitus, Poorly  controlled, With neuropathic complications -  Most recent A1c of 9.2 %. Goal A1c <7.0 %.  - A1c remains above goal  -Intolerant to higher doses of metformin -He me he is out of Trulicity due to shortage and supplies?  And the cost of $300 -We discussed increasing glipizide to twice daily dosing, but since his eating habits are erratic he is not sure if this will be a good option, I have opted to change his glipizide to XL as below -We will try to prescribe Trulicity to his new pharmacy and see if this is something he can obtain or not  MEDICATIONS: -Continue metformin 500 mg XR, 2 tablets daily  -Restart Trulicity to 3 mg weekly ( Sundays )  -Continue Farxiga 10 mg daily -Change glipizide 5 mg XL, 1 tablet daily    EDUCATION / INSTRUCTIONS: BG monitoring instructions: Patient is instructed to check his blood sugars 1 times a day Call [facility] if: BG persistently < 70  I reviewed the Rule of 15 for the treatment of hypoglycemia in detail with the patient. Literature supplied.   2) Diabetic complications:  Eye: Unknown to have known diabetic retinopathy.  Neuro/ Feet: Does have known diabetic peripheral neuropathy. Based on loss of sensation at the right and 3rd toes  Renal: Patient does not have known baseline CKD. He is  on an ACEI/ARB at present.       3) Dyslipidemia :   - LDL at goal   Medication  Continue Atorvastatin    4)Hypogonadism:  -This has been  chronic in nature, used to be on topical testosterone but he did not like that. -We discussed side effects of testosterone therapy such as erythropoiesis, worsening of sleep apnea that is severe and untreated,  Prostate volumes and serum PSA increase in response to testosterone treatment which might increase BPH and worsen urinary outflow obstruction as well as prostate cancer risk.  - We also discussed there is a possibility of increased cardiovascular risk associated with testosterone use. -We have opted to start him on intramuscular injections, he was shown on injection technique by my assistant  Medication Start testosterone cypionate 200 mg/mL 100 mg (0.5 mL) every 14 days   F/U in 4 months   Signed electronically by: Mack Guise, MD  [facility]  [address] [place], [place] [address] Phone: [phone] FAX: [phone]   CC: Irene Pap, PA-C No address on file Phone: None  Fax: None  Return to Endocrinology clinic as below: Future Appointments  Date Time Provider [place]  08/20/2022  7:30 AM Haely Leyland, Melanie Crazier, MD [facility] None

## 2022-04-03 ENCOUNTER — Encounter: Payer: Self-pay | Admitting: Internal Medicine

## 2022-04-06 ENCOUNTER — Encounter: Payer: 59 | Admitting: Physician Assistant

## 2022-04-11 ENCOUNTER — Other Ambulatory Visit (HOSPITAL_COMMUNITY): Payer: Self-pay

## 2022-04-11 ENCOUNTER — Telehealth: Payer: Self-pay

## 2022-04-11 NOTE — Telephone Encounter (Signed)
Patient Advocate Encounter  Received notification from [name] that the request for prior authorization for Testosterone Cypionate '200MG'$ /ML intramuscular solution has been denied due to You do not meet the requirements of your plan. Your plan covers this drug when you meet one of these conditions:-You have primary or hypogonadotropic hypogonadism -You have gender dysphoria, and you can make an informed decision to use this drug. Your request has been denied based on the information we have.   Key: Tim Allen

## 2022-04-11 NOTE — Telephone Encounter (Signed)
Patient Advocate Encounter   Received notification that prior authorization for Testosterone Cypionate '200MG'$ /ML intramuscular solution is required/requested.  PA submitted on 04/11/2022 to [facility] via CoverMyMeds Key [id number] Status is pending

## 2022-04-12 NOTE — Telephone Encounter (Addendum)
Patient Advocate Encounter  Prior Authorization for Testosterone Cypionate '200MG'$ /ML intramuscular solution has been approved.    PA#  [id number] Key: [id number]  Approved from 04/12/2022 to 04/11/2025

## 2022-05-08 ENCOUNTER — Encounter: Payer: Self-pay | Admitting: Internal Medicine

## 2022-06-06 DIAGNOSIS — S060X0A Concussion without loss of consciousness, initial encounter: Secondary | ICD-10-CM | POA: Diagnosis not present

## 2022-07-05 ENCOUNTER — Ambulatory Visit: Payer: 59 | Admitting: Internal Medicine

## 2022-08-12 ENCOUNTER — Other Ambulatory Visit: Payer: Self-pay | Admitting: Family Medicine

## 2022-08-20 ENCOUNTER — Ambulatory Visit: Payer: 59 | Admitting: Internal Medicine

## 2022-10-02 ENCOUNTER — Other Ambulatory Visit: Payer: Self-pay | Admitting: Internal Medicine

## 2022-10-17 ENCOUNTER — Encounter: Payer: Self-pay | Admitting: Nurse Practitioner

## 2022-10-17 ENCOUNTER — Ambulatory Visit (INDEPENDENT_AMBULATORY_CARE_PROVIDER_SITE_OTHER): Payer: 59 | Admitting: Nurse Practitioner

## 2022-10-17 VITALS — BP 132/84 | HR 88 | Ht 73.0 in | Wt 319.6 lb

## 2022-10-17 DIAGNOSIS — G4733 Obstructive sleep apnea (adult) (pediatric): Secondary | ICD-10-CM

## 2022-10-17 DIAGNOSIS — E1159 Type 2 diabetes mellitus with other circulatory complications: Secondary | ICD-10-CM

## 2022-10-17 DIAGNOSIS — E661 Drug-induced obesity: Secondary | ICD-10-CM

## 2022-10-17 DIAGNOSIS — E559 Vitamin D deficiency, unspecified: Secondary | ICD-10-CM | POA: Diagnosis not present

## 2022-10-17 DIAGNOSIS — Z6841 Body Mass Index (BMI) 40.0 and over, adult: Secondary | ICD-10-CM

## 2022-10-17 DIAGNOSIS — Z0000 Encounter for general adult medical examination without abnormal findings: Secondary | ICD-10-CM | POA: Insufficient documentation

## 2022-10-17 DIAGNOSIS — E1169 Type 2 diabetes mellitus with other specified complication: Secondary | ICD-10-CM

## 2022-10-17 DIAGNOSIS — R7989 Other specified abnormal findings of blood chemistry: Secondary | ICD-10-CM

## 2022-10-17 DIAGNOSIS — E291 Testicular hypofunction: Secondary | ICD-10-CM

## 2022-10-17 DIAGNOSIS — I2510 Atherosclerotic heart disease of native coronary artery without angina pectoris: Secondary | ICD-10-CM

## 2022-10-17 DIAGNOSIS — I152 Hypertension secondary to endocrine disorders: Secondary | ICD-10-CM

## 2022-10-17 DIAGNOSIS — E785 Hyperlipidemia, unspecified: Secondary | ICD-10-CM

## 2022-10-17 DIAGNOSIS — E1142 Type 2 diabetes mellitus with diabetic polyneuropathy: Secondary | ICD-10-CM

## 2022-10-17 DIAGNOSIS — F17200 Nicotine dependence, unspecified, uncomplicated: Secondary | ICD-10-CM

## 2022-10-17 DIAGNOSIS — E782 Mixed hyperlipidemia: Secondary | ICD-10-CM

## 2022-10-17 DIAGNOSIS — E1165 Type 2 diabetes mellitus with hyperglycemia: Secondary | ICD-10-CM

## 2022-10-17 LAB — CBC WITH DIFFERENTIAL/PLATELET
Basophils Absolute: 0.1 10*3/uL (ref 0.0–0.2)
Lymphocytes Absolute: 3.5 10*3/uL — ABNORMAL HIGH (ref 0.7–3.1)
MCH: 30.4 pg (ref 26.6–33.0)
Monocytes Absolute: 0.8 10*3/uL (ref 0.1–0.9)
WBC: 8.7 10*3/uL (ref 3.4–10.8)

## 2022-10-17 LAB — COMPREHENSIVE METABOLIC PANEL

## 2022-10-17 LAB — LIPID PANEL

## 2022-10-17 MED ORDER — VITAMIN D (ERGOCALCIFEROL) 1.25 MG (50000 UNIT) PO CAPS: 50000.0000 [IU] | ORAL_CAPSULE | ORAL | 0 refills | Status: AC

## 2022-10-17 MED ORDER — ATORVASTATIN CALCIUM 20 MG PO TABS: 20.0000 mg | ORAL_TABLET | Freq: Every day | ORAL | 0 refills | Status: AC

## 2022-10-17 MED ORDER — ONETOUCH VERIO W/DEVICE KIT: 1.0000 | PACK | Freq: Every day | 0 refills | Status: AC

## 2022-10-17 MED ORDER — DAPAGLIFLOZIN PROPANEDIOL 10 MG PO TABS: 10.0000 mg | ORAL_TABLET | Freq: Every day | ORAL | 3 refills | Status: AC

## 2022-10-17 MED ORDER — TESTOSTERONE CYPIONATE 200 MG/ML IM SOLN: INTRAMUSCULAR | 4 refills | Status: AC

## 2022-10-17 MED ORDER — ONETOUCH VERIO VI STRP: ORAL_STRIP | 12 refills | Status: AC

## 2022-10-17 MED ORDER — TIRZEPATIDE 2.5 MG/0.5ML ~~LOC~~ SOAJ
2.5000 mg | SUBCUTANEOUS | 0 refills | Status: DC
Start: 2022-10-17 — End: 2022-11-22

## 2022-10-17 MED ORDER — ONETOUCH ULTRASOFT LANCETS MISC: 12 refills | Status: AC

## 2022-10-17 MED ORDER — LISINOPRIL 20 MG PO TABS: 20.0000 mg | ORAL_TABLET | Freq: Every day | ORAL | 0 refills | Status: AC

## 2022-10-17 NOTE — Assessment & Plan Note (Signed)
Blood pressure slightly elevated during visit today. I suspect this is due to anxiety with physical examination. We will continue to monitor. No changes to medication today. Will monitor labs.  Plan: - Labs today - Continue lisinopril. - Follow-up in 3-6 months based on labs.

## 2022-10-17 NOTE — Assessment & Plan Note (Signed)
Tim Allen does not feel is testosterone dose is making any difference in how he feels. He is not sure if the dosing is correct.  Plan: - monitor labs today and make changes to dose as appropriate.

## 2022-10-17 NOTE — Assessment & Plan Note (Signed)
CPE today. Labs pending. Will make changes as necessary based on results.  Review of HM activities and recommendations discussed and provided on AVS Anticipatory guidance, diet, and exercise recommendations provided.  Medications, allergies, and hx reviewed and updated as necessary.  Plan to f/u with CPE in 1 year or sooner for acute/chronic health needs as directed.   

## 2022-10-17 NOTE — Assessment & Plan Note (Signed)
Labs pending.  

## 2022-10-17 NOTE — Assessment & Plan Note (Signed)
Tim Allen is not interested in smoking cessation at this time. I encourage consideration of this for overall health benefits. If he is ready to quit, I recommend reaching out for support.

## 2022-10-17 NOTE — Assessment & Plan Note (Signed)
Using CPAP nightly with improvement of symptoms. Recommend continue use and let me know if there are any changes with increased daytime sleepiness, fatigue, memory changes, or apnea.

## 2022-10-17 NOTE — Assessment & Plan Note (Signed)
Tim Allen is currently on metformin, glipizide, and farxiga. He has not been taking trulicity due to cost issues. We discussed changing medication regimen as he is having trouble with GI distress and metformin. 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 It appears that insurance covers both mounjaro and ozempic.  Plan: - Greggory Keen has been sent to the pharmacy. If approved, I have a coupon so that the medication will cost $25.  - Continue current medications for now. Once on mounjaro, if approved, we will begin to taper off of the other medications as we increase the dosing.  - Check your blood sugars at least once a day.

## 2022-10-17 NOTE — Progress Notes (Signed)
BP 132/84   Pulse 88   Ht  (1.854 m)   Wt (!) 319 lb 9.6 oz (145 kg)   BMI 42.17 kg/m    Subjective:    Patient ID: Tim Allen, male    DOB: 01/19/1976, 47 y.o.   MRN: [id number]  HPI: Tim Allen is a 47 y.o. male presenting on 10/17/2022 for comprehensive medical examination.   Current medical concerns include: Tim Allen presents today for annual exam. He has no current chief complaints.   He reports taking testosterone, Trulicity, and Glipizide for diabetes management, but is unsure about his blood sugar levels as he has not been checking them regularly. He denies experiencing any symptoms of increased urination, excessive thirst, as well as any low blood sugar symptoms such as dizziness, nausea, or sudden sweating. He does endorse changes in vision and is overdue for eye exam.   Tim Allen reports longstanding issues with Metformin causing stomach upset, which has been consistent for the past 5-6 years. He has not been taking Trulicity due to its high cost and insurance coverage issues but is open to considering alternative medications for diabetes management.   The patient is using a CPAP machine and feels it is working well for him.  He reports occasional numbness and tingling in his fingers, which seems to occur more often while driving. The patient has had colon cancer screening last year and is up to date with it.  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 He smokes two packs of cigarettes per day and declines information in quitting.   Pertinent items are noted in HPI.  IMMUNIZATIONS:   Flu: Flu vaccine postponed until flu season Prevnar 13: Prevnar 13 N/A for this patient Pneumovax 23: Pneumovax 23 N/A for this patient Prevnar 20: Prevnar 20 N/A for this patient HPV: HPV N/A for this patient Vac Shingrix: Shingrix N/A for this patient Tetanus: Tetanus completed in the last 10 years Covid-19 vaccine status: Declined, Education has been provided  regarding the importance of this vaccine but patient still declined. Advised may receive this vaccine at local pharmacy or Health Dept.or vaccine clinic. Aware to provide a copy of the vaccination record if obtained from local pharmacy or Health Dept. Verbalized acceptance and understanding.  HEALTH MAINTENANCE: Colon Cancer Screening HM Status: is up to date STI Testing HM Status: was declined  PSA Screen HM Status: was completed today Lung Ca Screen plan for next year AAA Screen plan for future.   He reports regular vision exams q1-5y: No  He reports regular dental exams q 7m:  No  The patient does not eat regular meals due to occupation. He endorses exercise and/or activity of: Sedentary  He currently: Marital Status: married Living situation: with family Sexual: monogamous Occupation: truck Mudlogger Recent Depression Screen:     10/17/2022    8:51 AM 10/13/2021    2:18 PM 10/05/2019    8:20 AM 06/04/2018    9:08 AM 09/09/2017    9:31 AM  Depression screen PHQ 2/9  Decreased Interest 0 0 0 3 0  Down, Depressed, Hopeless 0 0 0 0 0  PHQ - 2 Score 0 0 0 3 0  Altered sleeping    0   Tired, decreased energy    3   Change in appetite    3   Feeling bad or failure about yourself     0   Trouble concentrating    0  Moving slowly or fidgety/restless    0   Suicidal thoughts    0   PHQ-9 Score    9   Difficult doing work/chores    Not difficult at all    Most Recent Anxiety Screen:     No data to display         Most Recent Falls Screen:    10/17/2022    8:51 AM 10/13/2021    2:18 PM 10/05/2019    8:20 AM 09/09/2017    9:31 AM 03/06/2017    2:36 PM  Fall Risk   Falls in the past year? 0 0 0 No No  Number falls in past yr: 0 0 0    Injury with Fall? 0 0 0    Risk for fall due to : No Fall Risks No Fall Risks     Follow up Falls evaluation completed Falls evaluation completed       Past medical history, surgical history, medications, allergies, family history and  social history reviewed with patient today and changes made to appropriate areas of the chart.  Past Medical History:  Past Medical History:  Diagnosis Date   BMI 45.0-49.9, adult 09/28/2014   Fatigue 06/04/2018   Heavy cigarette smoker    Hyperglycemia 09/28/2014   Glucosuria noted at DOT Exam 09/28/2014. Non-fasting glucose by finger stick 242 mg/dL.  Patient advised to establish with PCP for additional evaluation and treatment.  Will need diabetes education.   Intermittent palpitations 10/05/2019   Morbid obesity    Sleep apnea    Type 2 diabetes mellitus    new diagnosis with HgB A1c 13.9% on 08/27/16   Vitamin D deficiency 06/16/2018   Medications:  Current Outpatient Medications on File Prior to Visit  Medication Sig   glipiZIDE (GLUCOTROL XL) 5 MG 24 hr tablet Take 1 tablet (5 mg total) by mouth daily with breakfast.   ibuprofen (ADVIL) 800 MG tablet Take 1 tablet (800 mg total) by mouth 3 (three) times daily.   metFORMIN (GLUCOPHAGE-XR) 500 MG 24 hr tablet Take 2 tablets (1,000 mg total) by mouth daily with breakfast.   SYRINGE-NEEDLE, DISP, 3 ML (B-D 3CC LUER-LOK SYR 23GX1") 23G X 1" 3 ML MISC 1 Device by Does not apply route every 14 (fourteen) days.   No current facility-administered medications on file prior to visit.   Surgical History:  Past Surgical History:  Procedure Laterality Date   KNEE SURGERY Right age 46   reconstruction after traumatic injury   Allergies:  No Known Allergies Social History:  Social History   Socioeconomic History   Marital status: Married    Spouse name: Not on file   Number of children: 3   Years of education: Not on file   Highest education level: Not on file  Occupational History   Occupation: truck driver    Comment: furniture delivery  Tobacco Use   Smoking status: Every Day    Packs/day: 2.00    Years: 10.00    Additional pack years: 0.00    Total pack years: 20.00    Types: Cigarettes   Smokeless tobacco: Never   Substance and Sexual Activity   Alcohol use: Yes    Alcohol/week: 0.0 standard drinks of alcohol   Drug use: No   Sexual activity: Yes  Other Topics Concern   Not on file  Social History Narrative   Lives with his wife and 3 sons.   Social Determinants of Health   Financial Resource Strain: Not  on file  Food Insecurity: Not on file  Transportation Needs: Not on file  Physical Activity: Not on file  Stress: Not on file  Social Connections: Not on file  Intimate Partner Violence: Not on file   Social History   Tobacco Use  Smoking Status Every Day   Packs/day: 2.00   Years: 10.00   Additional pack years: 0.00   Total pack years: 20.00   Types: Cigarettes  Smokeless Tobacco Never   Social History   Substance and Sexual Activity  Alcohol Use Yes   Alcohol/week: 0.0 standard drinks of alcohol   Family History:  Family History  Problem Relation Age of Onset   Hypertension Mother    Hyperlipidemia Mother    Diabetes Father        sepsis. died at 70   Hypertension Father    Heart failure Father    Sickle cell trait Son    Mental illness Son    CVA Neg Hx    Prostate cancer Neg Hx    Breast cancer Neg Hx        Objective:    BP 132/84   Pulse 88   Ht 6\' 1"  (1.854 m)   Wt (!) 319 lb 9.6 oz (145 kg)   BMI 42.17 kg/m   Wt Readings from Last 3 Encounters:  10/17/22 (!) 319 lb 9.6 oz (145 kg)  04/02/22 (!) 314 lb (142.4 kg)  10/20/21 (!) 319 lb (144.7 kg)    Physical Exam Vitals and nursing note reviewed.  Constitutional:      General: He is not in acute distress.    Appearance: Normal appearance. He is obese.  HENT:     Head: Normocephalic and atraumatic.     Right Ear: Hearing, tympanic membrane, ear canal and external ear normal.     Left Ear: Hearing, tympanic membrane, ear canal and external ear normal.     Nose: Nose normal.     Right Sinus: No maxillary sinus tenderness or frontal sinus tenderness.     Left Sinus: No maxillary sinus tenderness  or frontal sinus tenderness.     Mouth/Throat:     Lips: Pink.     Mouth: Mucous membranes are moist.     Dentition: Abnormal dentition.     Pharynx: Oropharynx is clear.  Eyes:     General: Lids are normal. Vision grossly intact.     Extraocular Movements: Extraocular movements intact.     Conjunctiva/sclera: Conjunctivae normal.     Pupils: Pupils are equal, round, and reactive to light.     Funduscopic exam:    Right eye: No hemorrhage. Red reflex present.        Left eye: No hemorrhage. Red reflex present.    Visual Fields: Right eye visual fields normal and left eye visual fields normal.  Neck:     Thyroid: No thyromegaly.     Vascular: No carotid bruit or JVD.  Cardiovascular:     Rate and Rhythm: Normal rate and regular rhythm.     Chest Wall: PMI is not displaced.     Pulses: Normal pulses.          Dorsalis pedis pulses are 2+ on the right side and 2+ on the left side.       Posterior tibial pulses are 2+ on the right side and 2+ on the left side.     Heart sounds: Normal heart sounds. No murmur heard. Pulmonary:     Effort: Pulmonary effort is  normal. No respiratory distress.     Breath sounds: Normal breath sounds.  Chest:  Breasts:    Breasts are symmetrical.  Abdominal:     General: Bowel sounds are normal. There is no distension or abdominal bruit.     Palpations: Abdomen is soft. There is no hepatomegaly, splenomegaly or mass.     Tenderness: There is no abdominal tenderness. There is no right CVA tenderness, left CVA tenderness, guarding or rebound.  Musculoskeletal:        General: Normal range of motion.     Cervical back: Full passive range of motion without pain, normal range of motion and neck supple. No tenderness. No spinous process tenderness or muscular tenderness.     Right lower leg: No edema.     Left lower leg: No edema.  Feet:     Right foot:     Toenail Condition: Right toenails are normal.     Left foot:     Toenail Condition: Left toenails  are normal.  Lymphadenopathy:     Cervical: No cervical adenopathy.     Upper Body:     Right upper body: No supraclavicular adenopathy.     Left upper body: No supraclavicular adenopathy.  Skin:    General: Skin is warm and dry.     Capillary Refill: Capillary refill takes less than 2 seconds.     Nails: There is no clubbing.  Neurological:     General: No focal deficit present.     Mental Status: He is alert and oriented to person, place, and time.     Cranial Nerves: No cranial nerve deficit.     Sensory: Sensation is intact. No sensory deficit.     Motor: Motor function is intact. No weakness.     Coordination: Coordination is intact. Coordination normal.     Gait: Gait is intact. Gait normal.  Psychiatric:        Attention and Perception: Attention normal.        Mood and Affect: Mood normal.        Speech: Speech normal.        Behavior: Behavior is cooperative.        Cognition and Memory: Cognition and memory normal.     Results for orders placed or performed in visit on 04/02/22  POCT glycosylated hemoglobin (Hb A1C)  Result Value Ref Range   Hemoglobin A1C 9.2 (A) 4.0 - 5.6 %   HbA1c POC (<> result, manual entry)     HbA1c, POC (prediabetic range)     HbA1c, POC (controlled diabetic range)    POCT Glucose (Device for Home Use)  Result Value Ref Range   Glucose Fasting, POC 226 (A) 70 - 99 mg/dL   POC Glucose        Assessment & Plan:   Problem List Items Addressed This Visit     OSA on CPAP (Chronic)    Using CPAP nightly with improvement of symptoms. Recommend continue use and let me know if there are any changes with increased daytime sleepiness, fatigue, memory changes, or apnea.       Relevant Medications   testosterone cypionate (DEPOTESTOSTERONE CYPIONATE) 200 MG/ML injection   dapagliflozin propanediol (FARXIGA) 10 MG TABS tablet   atorvastatin (LIPITOR) 20 MG tablet   lisinopril (ZESTRIL) 20 MG tablet   glucose blood (ONETOUCH VERIO) test strip    Blood Glucose Monitoring Suppl (ONETOUCH VERIO) w/Device KIT   Lancets (ONETOUCH ULTRASOFT) lancets   Vitamin D, Ergocalciferol, (DRISDOL)  1.25 MG (50000 UNIT) CAPS capsule   tirzepatide (MOUNJARO) 2.5 MG/0.5ML Pen   Other Relevant Orders   Hemoglobin A1c   CBC with Differential/Platelet   Comprehensive metabolic panel   VITAMIN D 25 Hydroxy (Vit-D Deficiency, Fractures)   Lipid panel   Testosterone Free with SHBG   PSA Total (Reflex To Free)   Type 2 diabetes mellitus with diabetic polyneuropathy, without long-term current use of insulin    Barack is currently on metformin, glipizide, and farxiga. He has not been taking trulicity due to cost issues. We discussed changing medication regimen as he is having trouble with GI distress and metformin. 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 It appears that insurance covers both mounjaro and ozempic.  Plan: - Greggory Keen has been sent to the pharmacy. If approved, I have a coupon so that the medication will cost $25.  - Continue current medications for now. Once on mounjaro, if approved, we will begin to taper off of the other medications as we increase the dosing.  - Check your blood sugars at least once a day.       Relevant Medications   testosterone cypionate (DEPOTESTOSTERONE CYPIONATE) 200 MG/ML injection   dapagliflozin propanediol (FARXIGA) 10 MG TABS tablet   atorvastatin (LIPITOR) 20 MG tablet   lisinopril (ZESTRIL) 20 MG tablet   glucose blood (ONETOUCH VERIO) test strip   Blood Glucose Monitoring Suppl (ONETOUCH VERIO) w/Device KIT   Lancets (ONETOUCH ULTRASOFT) lancets   Vitamin D, Ergocalciferol, (DRISDOL) 1.25 MG (50000 UNIT) CAPS capsule   tirzepatide (MOUNJARO) 2.5 MG/0.5ML Pen   Other Relevant Orders   Hemoglobin A1c   CBC with Differential/Platelet   Comprehensive metabolic panel   VITAMIN D 25 Hydroxy (Vit-D Deficiency, Fractures)   Lipid panel   Testosterone Free with  SHBG   PSA Total (Reflex To Free)   Heavy smoker    Sage is not interested in smoking cessation at this time. I encourage consideration of this for overall health benefits. If he is ready to quit, I recommend reaching out for support.       Hypogonadism in male    Ranson does not feel is testosterone dose is making any difference in how he feels. He is not sure if the dosing is correct.  Plan: - monitor labs today and make changes to dose as appropriate.       Relevant Medications   testosterone cypionate (DEPOTESTOSTERONE CYPIONATE) 200 MG/ML injection   dapagliflozin propanediol (FARXIGA) 10 MG TABS tablet   atorvastatin (LIPITOR) 20 MG tablet   lisinopril (ZESTRIL) 20 MG tablet   glucose blood (ONETOUCH VERIO) test strip   Blood Glucose Monitoring Suppl (ONETOUCH VERIO) w/Device KIT   Lancets (ONETOUCH ULTRASOFT) lancets   Vitamin D, Ergocalciferol, (DRISDOL) 1.25 MG (50000 UNIT) CAPS capsule   Other Relevant Orders   Hemoglobin A1c   CBC with Differential/Platelet   Comprehensive metabolic panel   VITAMIN D 25 Hydroxy (Vit-D Deficiency, Fractures)   Lipid panel   Testosterone Free with SHBG   PSA Total (Reflex To Free)   Hyperlipidemia associated with type 2 diabetes mellitus    Labs pending.       Relevant Medications   testosterone cypionate (DEPOTESTOSTERONE CYPIONATE) 200 MG/ML injection   dapagliflozin propanediol (FARXIGA) 10 MG TABS tablet   atorvastatin (LIPITOR) 20 MG tablet   lisinopril (ZESTRIL) 20 MG tablet   glucose blood (ONETOUCH VERIO) test strip   Blood Glucose Monitoring Suppl (  ONETOUCH VERIO) w/Device KIT   Lancets (ONETOUCH ULTRASOFT) lancets   Vitamin D, Ergocalciferol, (DRISDOL) 1.25 MG (50000 UNIT) CAPS capsule   tirzepatide (MOUNJARO) 2.5 MG/0.5ML Pen   Hypertension associated with diabetes    Blood pressure slightly elevated during visit today. I suspect this is due to anxiety with physical examination. We will continue to monitor. No changes  to medication today. Will monitor labs.  Plan: - Labs today - Continue lisinopril. - Follow-up in 3-6 months based on labs.       Relevant Medications   testosterone cypionate (DEPOTESTOSTERONE CYPIONATE) 200 MG/ML injection   dapagliflozin propanediol (FARXIGA) 10 MG TABS tablet   atorvastatin (LIPITOR) 20 MG tablet   lisinopril (ZESTRIL) 20 MG tablet   glucose blood (ONETOUCH VERIO) test strip   Blood Glucose Monitoring Suppl (ONETOUCH VERIO) w/Device KIT   Lancets (ONETOUCH ULTRASOFT) lancets   Vitamin D, Ergocalciferol, (DRISDOL) 1.25 MG (50000 UNIT) CAPS capsule   tirzepatide (MOUNJARO) 2.5 MG/0.5ML Pen   Other Relevant Orders   Hemoglobin A1c   CBC with Differential/Platelet   Comprehensive metabolic panel   VITAMIN D 25 Hydroxy (Vit-D Deficiency, Fractures)   Lipid panel   Testosterone Free with SHBG   PSA Total (Reflex To Free)   Class 3 drug-induced obesity with serious comorbidity and body mass index (BMI) of 40.0 to 44.9 in adult   Relevant Medications   testosterone cypionate (DEPOTESTOSTERONE CYPIONATE) 200 MG/ML injection   dapagliflozin propanediol (FARXIGA) 10 MG TABS tablet   atorvastatin (LIPITOR) 20 MG tablet   lisinopril (ZESTRIL) 20 MG tablet   glucose blood (ONETOUCH VERIO) test strip   Blood Glucose Monitoring Suppl (ONETOUCH VERIO) w/Device KIT   Lancets (ONETOUCH ULTRASOFT) lancets   Vitamin D, Ergocalciferol, (DRISDOL) 1.25 MG (50000 UNIT) CAPS capsule   tirzepatide (MOUNJARO) 2.5 MG/0.5ML Pen   Other Relevant Orders   Hemoglobin A1c   CBC with Differential/Platelet   Comprehensive metabolic panel   VITAMIN D 25 Hydroxy (Vit-D Deficiency, Fractures)   Lipid panel   Testosterone Free with SHBG   PSA Total (Reflex To Free)   Encounter for annual physical exam - Primary    CPE today .  Labs pending. Will make changes as necessary based on results.  Review of HM activities and recommendations discussed and provided on AVS Anticipatory guidance,  diet, and exercise recommendations provided.  Medications, allergies, and hx reviewed and updated as necessary.  Plan to f/u with CPE in 1 year or sooner for acute/chronic health needs as directed.        Vitamin D deficiency   Relevant Medications   testosterone cypionate (DEPOTESTOSTERONE CYPIONATE) 200 MG/ML injection   dapagliflozin propanediol (FARXIGA) 10 MG TABS tablet   atorvastatin (LIPITOR) 20 MG tablet   lisinopril (ZESTRIL) 20 MG tablet   glucose blood (ONETOUCH VERIO) test strip   Blood Glucose Monitoring Suppl (ONETOUCH VERIO) w/Device KIT   Lancets (ONETOUCH ULTRASOFT) lancets   Vitamin D, Ergocalciferol, (DRISDOL) 1.25 MG (50000 UNIT) CAPS capsule   Other Relevant Orders   Hemoglobin A1c   CBC with Differential/Platelet   Comprehensive metabolic panel   VITAMIN D 25 Hydroxy (Vit-D Deficiency, Fractures)   Lipid panel   Testosterone Free with SHBG   PSA Total (Reflex To Free)   ASCVD (arteriosclerotic cardiovascular disease)   Relevant Medications   testosterone cypionate (DEPOTESTOSTERONE CYPIONATE) 200 MG/ML injection   dapagliflozin propanediol (FARXIGA) 10 MG TABS tablet   atorvastatin (LIPITOR) 20 MG tablet   lisinopril (ZESTRIL) 20 MG tablet  glucose blood (ONETOUCH VERIO) test strip   Blood Glucose Monitoring Suppl (ONETOUCH VERIO) w/Device KIT   Lancets (ONETOUCH ULTRASOFT) lancets   Vitamin D, Ergocalciferol, (DRISDOL) 1.25 MG (50000 UNIT) CAPS capsule   tirzepatide (MOUNJARO) 2.5 MG/0.5ML Pen   Other Relevant Orders   Hemoglobin A1c   CBC with Differential/Platelet   Comprehensive metabolic panel   VITAMIN D 25 Hydroxy (Vit-D Deficiency, Fractures)   Lipid panel   Testosterone Free with SHBG   PSA Total (Reflex To Free)   Other Visit Diagnoses     Low testosterone in male       Relevant Medications   testosterone cypionate (DEPOTESTOSTERONE CYPIONATE) 200 MG/ML injection   dapagliflozin propanediol (FARXIGA) 10 MG TABS tablet   atorvastatin  (LIPITOR) 20 MG tablet   lisinopril (ZESTRIL) 20 MG tablet   glucose blood (ONETOUCH VERIO) test strip   Blood Glucose Monitoring Suppl (ONETOUCH VERIO) w/Device KIT   Lancets (ONETOUCH ULTRASOFT) lancets   Vitamin D, Ergocalciferol, (DRISDOL) 1.25 MG (50000 UNIT) CAPS capsule   tirzepatide (MOUNJARO) 2.5 MG/0.5ML Pen   Other Relevant Orders   Hemoglobin A1c   CBC with Differential/Platelet   Comprehensive metabolic panel   VITAMIN D 25 Hydroxy (Vit-D Deficiency, Fractures)   Lipid panel   Testosterone Free with SHBG   PSA Total (Reflex To Free)   Type 2 diabetes mellitus with hyperglycemia, without long-term current use of insulin       Relevant Medications   testosterone cypionate (DEPOTESTOSTERONE CYPIONATE) 200 MG/ML injection   dapagliflozin propanediol (FARXIGA) 10 MG TABS tablet   atorvastatin (LIPITOR) 20 MG tablet   lisinopril (ZESTRIL) 20 MG tablet   glucose blood (ONETOUCH VERIO) test strip   Blood Glucose Monitoring Suppl (ONETOUCH VERIO) w/Device KIT   Lancets (ONETOUCH ULTRASOFT) lancets   Vitamin D, Ergocalciferol, (DRISDOL) 1.25 MG (50000 UNIT) CAPS capsule   tirzepatide (MOUNJARO) 2.5 MG/0.5ML Pen   Other Relevant Orders   Hemoglobin A1c   CBC with Differential/Platelet   Comprehensive metabolic panel   VITAMIN D 25 Hydroxy (Vit-D Deficiency, Fractures)   Lipid panel   Testosterone Free with SHBG   PSA Total (Reflex To Free)   Uncontrolled type 2 diabetes mellitus with hyperglycemia       Relevant Medications   testosterone cypionate (DEPOTESTOSTERONE CYPIONATE) 200 MG/ML injection   dapagliflozin propanediol (FARXIGA) 10 MG TABS tablet   atorvastatin (LIPITOR) 20 MG tablet   lisinopril (ZESTRIL) 20 MG tablet   glucose blood (ONETOUCH VERIO) test strip   Blood Glucose Monitoring Suppl (ONETOUCH VERIO) w/Device KIT   Lancets (ONETOUCH ULTRASOFT) lancets   Vitamin D, Ergocalciferol, (DRISDOL) 1.25 MG (50000 UNIT) CAPS capsule   tirzepatide (MOUNJARO) 2.5  MG/0.5ML Pen   Other Relevant Orders   Hemoglobin A1c   CBC with Differential/Platelet   Comprehensive metabolic panel   VITAMIN D 25 Hydroxy (Vit-D Deficiency, Fractures)   Lipid panel   Testosterone Free with SHBG   PSA Total (Reflex To Free)        Follow up plan: NEXT PREVENTATIVE PHYSICAL DUE IN 1 YEAR. Return in about 6 months (around 04/18/2023) for Med Management.  LABORATORY TESTING:  Health maintenance labs ordered today, if applicable.    PATIENT COUNSELING:   For all adult patients, I recommend A well balanced diet low in saturated fats, cholesterol, and moderation in carbohydrates.  This can be as simple as monitoring portion sizes and cutting back on sugary beverages such as soda and juice to start with.    Daily  water consumption of at least 64 ounces.  Physical activity at least 180 minutes per week, if just starting out.  This can be as simple as taking the stairs instead of the elevator and walking 2-3 laps around the office  purposefully every day.   STD protection, partner selection, and regular testing if high risk.  Limited consumption of alcoholic beverages if alcohol is consumed. For men, I recommend no more than 14 alcoholic beverages per week, spread out throughout the week (max 2 per day). Avoid "binge" drinking or consuming large quantities of alcohol in one setting.  Please let me know if you feel you may need help with reduction or quitting alcohol consumption.   Avoidance of nicotine, if used. Please let me know if you feel you may need help with reduction or quitting nicotine use.   Daily mental health attention. This can be in the form of 5 minute daily meditation, prayer, journaling, yoga, reflection, etc.  Purposeful attention to your emotions and mental state can significantly improve your overall wellbeing  and  Health.  Please know that I am here to help you with all of your health care goals and am happy to work with you to find a  solution that works best for you.  The greatest advice I have received with any changes in life are to take it one step at a time, that even means if all you can focus on is the next 60 seconds, then do that and celebrate your victories.  With any changes in life, you will have set backs, and that is OK. The important thing to remember is, if you have a set back, it is not a failure, it is an opportunity to try again!  Health Maintenance Recommendations Screening Testing Mammogram Every 1 -2 years based on history and risk factors Starting at age 71 Pap Smear Ages 21-39 every 3 years Ages 67-65 every 5 years with HPV testing More frequent testing may be required based on results and history Colon Cancer Screening Every 1-10 years based on test performed, risk factors, and history Starting at age 56 Bone Density Screening Every 2-10 years based on history Starting at age 65 for women Recommendations for men differ based on medication usage, history, and risk factors AAA Screening One time ultrasound Men 32-53 years old who have every smoked Lung Cancer Screening Low Dose Lung CT every 12 months Age 29-80 years with a 30 pack-year smoking history who still smoke or who have quit within the last 15 years   Screening Labs Routine  Labs: Complete Blood Count (CBC), Complete Metabolic Panel (CMP), Cholesterol (Lipid Panel) Every 6-12 months based on history and medications May be recommended more frequently based on current conditions or previous results Hemoglobin A1c Lab Every 3-12 months based on history and previous results Starting at age 47 or earlier with diagnosis of diabetes, high cholesterol, BMI >26, and/or risk factors Frequent monitoring for patients with diabetes to ensure blood sugar control Thyroid Panel (TSH) Every 6 months based on history, symptoms, and risk factors May be repeated more often if on medication HIV One time testing for all patients 31 and older May  be repeated more frequently for patients with increased risk factors or exposure Hepatitis C One time testing for all patients 43 and older May be repeated more frequently for patients with increased risk factors or exposure Gonorrhea, Chlamydia Every 12 months for all sexually active persons 13-24 years Additional monitoring may be recommended for those  who are considered high risk or who have symptoms Every 12 months for any woman on birth control, regardless of sexual activity PSA Men 57-72 years old with risk factors Additional screening may be recommended from age 73-69 based on risk factors, symptoms, and history  Vaccine Recommendations Tetanus Booster All adults every 10 years Flu Vaccine All patients 6 months and older every year COVID Vaccine All patients 12 years and older Initial dosing with booster May recommend additional booster based on age and health history HPV Vaccine 2 doses all patients age 21-26 Dosing may be considered for patients over 26 Shingles Vaccine (Shingrix) 2 doses all adults 55 years and older Pneumonia (Pneumovax 53) All adults 65 years and older May recommend earlier dosing based on health history One year apart from Prevnar 28 Pneumonia (Prevnar 28) All adults 65 years and older Dosed 1 year after Pneumovax 23 Pneumonia (Prevnar 20) One time alternative to the two dosing of 13 and 23 For all adults with initial dose of 23, 20 is recommended 1 year later For all adults with initial dose of 13, 23 is still recommended as second option 1 year later  Additional Screening, Testing, and Vaccinations may be recommended on an individualized basis based on family history, health history, risk factors, and/or exposure.

## 2022-10-17 NOTE — Patient Instructions (Addendum)
I have sent in a new prescription for a medication called Mounjaro. We will work to get prior authorization for this. If we get approval, we will see what else we can stop to help control your blood sugars better.   I will be in touch with you once we get your labs back to let you know what we need.   I do recommend you get an eye exam in the near future. Diabetes can really affect your vision and we want to be sure that we are keeping you well protected.    For all adult patients, I recommend A well balanced diet low in saturated fats, cholesterol, and moderation in carbohydrates.   This can be as simple as monitoring portion sizes and cutting back on sugary beverages such as soda and juice to start with.    Daily water consumption of at least 64 ounces.  Physical activity at least 180 minutes per week, if just starting out.   This can be as simple as taking the stairs instead of the elevator and walking 2-3 laps around the office  purposefully every day.   STD protection, partner selection, and regular testing if high risk.  Limited consumption of alcoholic beverages if alcohol is consumed.  For women, I recommend no more than 7 alcoholic beverages per week, spread out throughout the week.  Avoid "binge" drinking or consuming large quantities of alcohol in one setting.   Please let me know if you feel you may need help with reduction or quitting alcohol consumption.   Avoidance of nicotine, if used.  Please let me know if you feel you may need help with reduction or quitting nicotine use.   Daily mental health attention.  This can be in the form of 5 minute daily meditation, prayer, journaling, yoga, reflection, etc.   Purposeful attention to your emotions and mental state can significantly improve your overall wellbeing  and  Health.  Please know that I am here to help you with all of your health care goals and am happy to work with you to find a solution that works best for you.   The greatest advice I have received with any changes in life are to take it one step at a time, that even means if all you can focus on is the next 60 seconds, then do that and celebrate your victories.  With any changes in life, you will have set backs, and that is OK. The important thing to remember is, if you have a set back, it is not a failure, it is an opportunity to try again!  Health Maintenance Recommendations Screening Testing Mammogram Every 1 -2 years based on history and risk factors Starting at age 28 Pap Smear Ages 21-39 every 3 years Ages 22-65 every 5 years with HPV testing More frequent testing may be required based on results and history Colon Cancer Screening Every 1-10 years based on test performed, risk factors, and history Starting at age 76 Bone Density Screening Every 2-10 years based on history Starting at age 36 for women Recommendations for men differ based on medication usage, history, and risk factors AAA Screening One time ultrasound Men 19-22 years old who have every smoked Lung Cancer Screening Low Dose Lung CT every 12 months Age 34-80 years with a 30 pack-year smoking history who still smoke or who have quit within the last 15 years  Screening Labs Routine  Labs: Complete Blood Count (CBC), Complete Metabolic Panel (CMP), Cholesterol (Lipid Panel)  Every 6-12 months based on history and medications May be recommended more frequently based on current conditions or previous results Hemoglobin A1c Lab Every 3-12 months based on history and previous results Starting at age 34 or earlier with diagnosis of diabetes, high cholesterol, BMI >26, and/or risk factors Frequent monitoring for patients with diabetes to ensure blood sugar control Thyroid Panel (TSH w/ T3 & T4) Every 6 months based on history, symptoms, and risk factors May be repeated more often if on medication HIV One time testing for all patients 73 and older May be repeated more  frequently for patients with increased risk factors or exposure Hepatitis C One time testing for all patients 76 and older May be repeated more frequently for patients with increased risk factors or exposure Gonorrhea, Chlamydia Every 12 months for all sexually active persons 13-24 years Additional monitoring may be recommended for those who are considered high risk or who have symptoms PSA Men 57-35 years old with risk factors Additional screening may be recommended from age 66-69 based on risk factors, symptoms, and history  Vaccine Recommendations Tetanus Booster All adults every 10 years Flu Vaccine All patients 6 months and older every year COVID Vaccine All patients 12 years and older Initial dosing with booster May recommend additional booster based on age and health history HPV Vaccine 2 doses all patients age 44-26 Dosing may be considered for patients over 26 Shingles Vaccine (Shingrix) 2 doses all adults 55 years and older Pneumonia (Pneumovax 23) All adults 65 years and older May recommend earlier dosing based on health history Pneumonia (Prevnar 18) All adults 65 years and older Dosed 1 year after Pneumovax 23  Additional Screening, Testing, and Vaccinations may be recommended on an individualized basis based on family history, health history, risk factors, and/or exposure.

## 2022-10-18 LAB — COMPREHENSIVE METABOLIC PANEL
AST: 20 IU/L (ref 0–40)
BUN/Creatinine Ratio: 9 (ref 9–20)

## 2022-10-18 LAB — LIPID PANEL
Chol/HDL Ratio: 3.9 ratio (ref 0.0–5.0)
Cholesterol, Total: 134 mg/dL (ref 100–199)
HDL: 34 mg/dL — ABNORMAL LOW (ref >39)
VLDL Cholesterol Cal: 23 mg/dL (ref 5–40)

## 2022-10-18 LAB — CBC WITH DIFFERENTIAL/PLATELET
Eos: 1 %
Neutrophils Absolute: 4.3 10*3/uL (ref 1.4–7.0)
Neutrophils: 49 %

## 2022-10-18 LAB — TESTOSTERONE, FREE AND TOTAL (INCLUDES SHBG)-(MALES)

## 2022-10-18 LAB — HEMOGLOBIN A1C: Hgb A1c MFr Bld: 10.2 % — ABNORMAL HIGH (ref 4.8–5.6)

## 2022-10-22 LAB — TESTOSTERONE, FREE AND TOTAL (INCLUDES SHBG)-(MALES): Sex Hormone Binding Globulin: 10.8 nmol/L — ABNORMAL LOW

## 2022-10-22 LAB — COMPREHENSIVE METABOLIC PANEL
CO2: 20 mmol/L (ref 20–29)
Chloride: 100 mmol/L (ref 96–106)

## 2022-10-22 LAB — CBC WITH DIFFERENTIAL/PLATELET
Basos: 1 %
Hemoglobin: 16.2 g/dL (ref 13.0–17.7)

## 2022-10-24 LAB — TESTOSTERONE, FREE AND TOTAL (INCLUDES SHBG)-(MALES)

## 2022-10-24 LAB — COMPREHENSIVE METABOLIC PANEL
Bilirubin Total: 0.3 mg/dL (ref 0.0–1.2)
Sodium: 137 mmol/L (ref 134–144)

## 2022-10-24 LAB — CBC WITH DIFFERENTIAL/PLATELET
Hematocrit: 47.9 % (ref 37.5–51.0)
Platelets: 233 10*3/uL (ref 150–450)

## 2022-10-24 LAB — HEMOGLOBIN A1C: Est. average glucose Bld gHb Est-mCnc: 246 mg/dL

## 2022-10-25 LAB — COMPREHENSIVE METABOLIC PANEL
Albumin/Globulin Ratio: 1.7 (ref 1.2–2.2)
Albumin: 4.5 g/dL (ref 4.1–5.1)
Alkaline Phosphatase: 74 IU/L (ref 44–121)
BUN: 7 mg/dL (ref 6–24)
Calcium: 9.4 mg/dL (ref 8.7–10.2)
Creatinine, Ser: 0.8 mg/dL (ref 0.76–1.27)
Globulin, Total: 2.6 g/dL (ref 1.5–4.5)
Glucose: 162 mg/dL — ABNORMAL HIGH (ref 70–99)
Total Protein: 7.1 g/dL (ref 6.0–8.5)
eGFR: 110 mL/min/{1.73_m2} (ref >59)

## 2022-10-25 LAB — VITAMIN D 25 HYDROXY (VIT D DEFICIENCY, FRACTURES): Vit D, 25-Hydroxy: 17.4 ng/mL — ABNORMAL LOW (ref 30.0–100.0)

## 2022-10-25 LAB — CBC WITH DIFFERENTIAL/PLATELET
EOS (ABSOLUTE): 0.1 10*3/uL (ref 0.0–0.4)
Immature Grans (Abs): 0 10*3/uL (ref 0.0–0.1)
Immature Granulocytes: 0 %
Lymphs: 40 %
MCHC: 33.8 g/dL (ref 31.5–35.7)
MCV: 90 fL (ref 79–97)
Monocytes: 9 %
RBC: 5.33 x10E6/uL (ref 4.14–5.80)
RDW: 13 % (ref 11.6–15.4)

## 2022-10-25 LAB — TESTOSTERONE, FREE AND TOTAL (INCLUDES SHBG)-(MALES): Testosterone, Serum (Total): 216 ng/dL — ABNORMAL LOW

## 2022-10-25 LAB — LIPID PANEL: Triglycerides: 127 mg/dL (ref 0–149)

## 2022-10-25 LAB — PSA TOTAL (REFLEX TO FREE): Prostate Specific Ag, Serum: 0.9 ng/mL (ref 0.0–4.0)

## 2022-10-26 ENCOUNTER — Telehealth: Payer: Self-pay | Admitting: Nurse Practitioner

## 2022-10-26 NOTE — Telephone Encounter (Signed)
Carlynn Spry approved til 10/23/23, sent mychart message

## 2022-10-30 ENCOUNTER — Telehealth: Payer: Self-pay | Admitting: Nurse Practitioner

## 2022-10-30 NOTE — Telephone Encounter (Signed)
P.A. TESTOSTERONE CYPIONATE °

## 2022-11-16 NOTE — Telephone Encounter (Signed)
P.A. approved til 11/01/23, sent mychart message

## 2022-11-22 ENCOUNTER — Other Ambulatory Visit: Payer: Self-pay | Admitting: Nurse Practitioner

## 2022-11-22 DIAGNOSIS — E1142 Type 2 diabetes mellitus with diabetic polyneuropathy: Secondary | ICD-10-CM

## 2022-11-22 DIAGNOSIS — I152 Hypertension secondary to endocrine disorders: Secondary | ICD-10-CM

## 2022-11-22 DIAGNOSIS — I2510 Atherosclerotic heart disease of native coronary artery without angina pectoris: Secondary | ICD-10-CM

## 2022-11-22 DIAGNOSIS — E66813 Obesity, class 3: Secondary | ICD-10-CM

## 2022-11-22 DIAGNOSIS — R7989 Other specified abnormal findings of blood chemistry: Secondary | ICD-10-CM

## 2022-11-22 DIAGNOSIS — E661 Drug-induced obesity: Secondary | ICD-10-CM

## 2022-11-22 DIAGNOSIS — E1159 Type 2 diabetes mellitus with other circulatory complications: Secondary | ICD-10-CM

## 2022-11-22 DIAGNOSIS — E782 Mixed hyperlipidemia: Secondary | ICD-10-CM

## 2022-11-22 DIAGNOSIS — G4733 Obstructive sleep apnea (adult) (pediatric): Secondary | ICD-10-CM

## 2022-11-22 DIAGNOSIS — E1165 Type 2 diabetes mellitus with hyperglycemia: Secondary | ICD-10-CM

## 2022-11-23 MED ORDER — TIRZEPATIDE 2.5 MG/0.5ML ~~LOC~~ SOPN
2.5000 mg | PEN_INJECTOR | SUBCUTANEOUS | 2 refills | Status: AC
Start: 2022-11-23 — End: ?

## 2022-12-25 ENCOUNTER — Telehealth: Payer: Self-pay | Admitting: Internal Medicine

## 2022-12-25 NOTE — Telephone Encounter (Signed)
P.A farixga sent through covermymeds. Waiting on response

## 2023-01-27 ENCOUNTER — Telehealth: Payer: Self-pay | Admitting: Nurse Practitioner

## 2023-01-27 NOTE — Telephone Encounter (Signed)
PA Case ID #: [id number] Rx #: [id number] Need Help? Call us at [phone] Outcome Approved today by [facility] NCPDP 2017 Your PA request has been approved. Additional information will be provided in the approval communication. 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

## 2023-01-28 ENCOUNTER — Telehealth: Payer: Self-pay | Admitting: Nurse Practitioner

## 2023-01-28 NOTE — Telephone Encounter (Signed)
P.A. FARXIGA completed & denied plan exclusion,  called plan & states pt's insurance not longer active. Called pt and he has new ins.  He will upload new card.  He will also take to pharmacy & see if new ins will pay for Farxiga & let us know if he has any issues.

## 2023-02-08 ENCOUNTER — Ambulatory Visit: Payer: 59 | Admitting: Internal Medicine

## 2023-04-18 ENCOUNTER — Encounter: Payer: 59 | Admitting: Nurse Practitioner

## 2023-10-23 ENCOUNTER — Telehealth: Payer: Self-pay

## 2023-10-23 ENCOUNTER — Other Ambulatory Visit (HOSPITAL_COMMUNITY): Payer: Self-pay

## 2023-10-23 NOTE — Telephone Encounter (Addendum)
 Our Team has received a request for a Prior Authorization today for the pts Mounjaro  2.5MG /0.5ML auto-injectors   Pharmacy Patient Advocate Encounter  Insurance verification completed.   The patient is insured through [facility]   Ran test claim for Mounjaro  2.5MG /0.5ML auto-injectors. Currently a quantity of 2ml is a 28 day supply and the co-pay is 0.00 . No P/A is needed at this time  This test claim was processed through [facility]- copay amounts may vary at other pharmacies due to pharmacy/plan contracts, or as the patient moves through the different stages of their insurance plan.

## 2023-10-25 ENCOUNTER — Other Ambulatory Visit (HOSPITAL_COMMUNITY): Payer: Self-pay

## 2023-10-28 ENCOUNTER — Other Ambulatory Visit (HOSPITAL_COMMUNITY): Payer: Self-pay

## 2023-12-30 ENCOUNTER — Telehealth: Payer: Self-pay

## 2023-12-30 ENCOUNTER — Other Ambulatory Visit (HOSPITAL_COMMUNITY): Payer: Self-pay

## 2023-12-30 NOTE — Telephone Encounter (Signed)
 Pharmacy Patient Advocate Encounter   Received notification from CoverMyMeds that prior authorization for Testosterone  Cypionate 200MG /ML intramuscular solution is required/requested.   Insurance verification completed.   The patient is insured through [facility] .   Per test claim: PA required; PA submitted to above mentioned insurance via CoverMyMeds Key/confirmation #/EOC (Key: [id number]) Status is pending

## 2024-01-01 ENCOUNTER — Other Ambulatory Visit (HOSPITAL_COMMUNITY): Payer: Self-pay

## 2024-01-01 NOTE — Telephone Encounter (Signed)
 Pharmacy Patient Advocate Encounter  Received notification from [facility] that Prior Authorization for Testosterone  Cypionate 200MG /ML intramuscular solution has been APPROVED from 7.7.25 to 7.7.26. Ran test claim, Copay is $RTS, RX WAS LAST FILLED ON 6.23.25. This test claim was processed through [facility]- copay amounts may vary at other pharmacies due to pharmacy/plan contracts, or as the patient moves through the different stages of their insurance plan.   PA #/Case ID/Reference #:  (Key: [id number])
